# Patient Record
Sex: Female | Born: 1970
Health system: Southern US, Community
[De-identification: ages and names within clinical notes are randomized; demographics above are authoritative.]

## PROBLEM LIST (undated history)

## (undated) DIAGNOSIS — E119 Type 2 diabetes mellitus without complications: Secondary | ICD-10-CM

## (undated) DIAGNOSIS — N289 Disorder of kidney and ureter, unspecified: Secondary | ICD-10-CM

## (undated) DIAGNOSIS — I10 Essential (primary) hypertension: Secondary | ICD-10-CM

## (undated) DIAGNOSIS — I219 Acute myocardial infarction, unspecified: Secondary | ICD-10-CM

## (undated) DIAGNOSIS — I2510 Atherosclerotic heart disease of native coronary artery without angina pectoris: Secondary | ICD-10-CM

## (undated) HISTORY — PX: ABDOMINAL HYSTERECTOMY: SHX81

---

## 2018-02-28 ENCOUNTER — Emergency Department (HOSPITAL_COMMUNITY)
Admission: EM | Admit: 2018-02-28 | Discharge: 2018-02-28 | Discharge status: Against Medical Advice | Payer: Self-pay | Attending: Emergency Medicine | Admitting: Emergency Medicine

## 2018-02-28 ENCOUNTER — Encounter (HOSPITAL_COMMUNITY): Payer: Self-pay | Admitting: Emergency Medicine

## 2018-02-28 ENCOUNTER — Other Ambulatory Visit: Payer: Self-pay

## 2018-02-28 ENCOUNTER — Emergency Department (HOSPITAL_COMMUNITY): Payer: Self-pay

## 2018-02-28 DIAGNOSIS — F17210 Nicotine dependence, cigarettes, uncomplicated: Secondary | ICD-10-CM | POA: Insufficient documentation

## 2018-02-28 DIAGNOSIS — R072 Precordial pain: Secondary | ICD-10-CM | POA: Insufficient documentation

## 2018-02-28 DIAGNOSIS — Z79899 Other long term (current) drug therapy: Secondary | ICD-10-CM | POA: Insufficient documentation

## 2018-02-28 DIAGNOSIS — I10 Essential (primary) hypertension: Secondary | ICD-10-CM | POA: Insufficient documentation

## 2018-02-28 DIAGNOSIS — E119 Type 2 diabetes mellitus without complications: Secondary | ICD-10-CM | POA: Insufficient documentation

## 2018-02-28 DIAGNOSIS — R0602 Shortness of breath: Secondary | ICD-10-CM | POA: Insufficient documentation

## 2018-02-28 DIAGNOSIS — I2510 Atherosclerotic heart disease of native coronary artery without angina pectoris: Secondary | ICD-10-CM | POA: Insufficient documentation

## 2018-02-28 DIAGNOSIS — R110 Nausea: Secondary | ICD-10-CM | POA: Insufficient documentation

## 2018-02-28 HISTORY — DX: Disorder of kidney and ureter, unspecified: N28.9

## 2018-02-28 HISTORY — DX: Type 2 diabetes mellitus without complications: E11.9

## 2018-02-28 HISTORY — DX: Atherosclerotic heart disease of native coronary artery without angina pectoris: I25.10

## 2018-02-28 HISTORY — DX: Essential (primary) hypertension: I10

## 2018-02-28 HISTORY — DX: Acute myocardial infarction, unspecified: I21.9

## 2018-02-28 LAB — BASIC METABOLIC PANEL
ANION GAP: 10 (ref 5–15)
BUN: 18 mg/dL (ref 6–20)
CO2: 21 mmol/L — AB (ref 22–32)
Calcium: 9 mg/dL (ref 8.9–10.3)
Chloride: 107 mmol/L (ref 101–111)
Creatinine, Ser: 0.92 mg/dL (ref 0.44–1.00)
GFR calc Af Amer: >60 mL/min (ref >60)
GFR calc non Af Amer: >60 mL/min (ref >60)
GLUCOSE: 105 mg/dL — AB (ref 65–99)
POTASSIUM: 3.8 mmol/L (ref 3.5–5.1)
Sodium: 138 mmol/L (ref 135–145)

## 2018-02-28 LAB — I-STAT BETA HCG BLOOD, ED (MC, WL, AP ONLY): I-stat hCG, quantitative: <5 m[IU]/mL (ref <5)

## 2018-02-28 LAB — CBC
HEMATOCRIT: 40.3 % (ref 36.0–46.0)
HEMOGLOBIN: 13.1 g/dL (ref 12.0–15.0)
MCH: 28.1 pg (ref 26.0–34.0)
MCHC: 32.5 g/dL (ref 30.0–36.0)
MCV: 86.3 fL (ref 78.0–100.0)
Platelets: 258 10*3/uL (ref 150–400)
RBC: 4.67 MIL/uL (ref 3.87–5.11)
RDW: 14.9 % (ref 11.5–15.5)
WBC: 9.8 10*3/uL (ref 4.0–10.5)

## 2018-02-28 LAB — I-STAT TROPONIN, ED: Troponin i, poc: 0 ng/mL (ref 0.00–0.08)

## 2018-02-28 MED ORDER — ACETAMINOPHEN 325 MG PO TABS
650.0000 mg | ORAL_TABLET | Freq: Once | ORAL | Status: AC
  Administered 2018-02-28: 650 mg via ORAL
  Filled 2018-02-28: qty 2

## 2018-02-28 MED ORDER — DOCUSATE SODIUM 100 MG PO CAPS
100.00 | ORAL_CAPSULE | ORAL | Status: DC
Start: ? — End: 2018-02-28

## 2018-02-28 MED ORDER — BENZTROPINE MESYLATE 1 MG PO TABS
1.00 | ORAL_TABLET | ORAL | Status: DC
Start: ? — End: 2018-02-28

## 2018-02-28 MED ORDER — BENZTROPINE MESYLATE 1 MG PO TABS
0.50 | ORAL_TABLET | ORAL | Status: DC
Start: 2018-02-26 — End: 2018-02-28

## 2018-02-28 MED ORDER — NICOTINE 14 MG/24HR TD PT24
1.00 | MEDICATED_PATCH | TRANSDERMAL | Status: DC
Start: 2018-02-27 — End: 2018-02-28

## 2018-02-28 MED ORDER — LORAZEPAM 2 MG/ML IJ SOLN
2.00 | INTRAMUSCULAR | Status: DC
Start: ? — End: 2018-02-28

## 2018-02-28 MED ORDER — AMLODIPINE BESYLATE 5 MG PO TABS
5.00 | ORAL_TABLET | ORAL | Status: DC
Start: 2018-02-27 — End: 2018-02-28

## 2018-02-28 MED ORDER — ALUMINUM-MAGNESIUM-SIMETHICONE 200-200-20 MG/5ML PO SUSP
30.00 | ORAL | Status: DC
Start: ? — End: 2018-02-28

## 2018-02-28 MED ORDER — PROMETHAZINE HCL 25 MG PO TABS
25.00 | ORAL_TABLET | ORAL | Status: DC
Start: ? — End: 2018-02-28

## 2018-02-28 MED ORDER — FLUOXETINE HCL 20 MG PO CAPS
20.00 | ORAL_CAPSULE | ORAL | Status: DC
Start: 2018-02-27 — End: 2018-02-28

## 2018-02-28 MED ORDER — CLONIDINE HCL 0.1 MG PO TABS
.10 | ORAL_TABLET | ORAL | Status: DC
Start: ? — End: 2018-02-28

## 2018-02-28 MED ORDER — ZIPRASIDONE MESYLATE 20 MG IM SOLR
20.00 | INTRAMUSCULAR | Status: DC
Start: ? — End: 2018-02-28

## 2018-02-28 MED ORDER — HALOPERIDOL 5 MG PO TABS
10.00 | ORAL_TABLET | ORAL | Status: DC
Start: 2018-02-26 — End: 2018-02-28

## 2018-02-28 MED ORDER — HALOPERIDOL 5 MG PO TABS
5.00 | ORAL_TABLET | ORAL | Status: DC
Start: ? — End: 2018-02-28

## 2018-02-28 MED ORDER — HYDROXYZINE HCL 25 MG PO TABS
50.00 | ORAL_TABLET | ORAL | Status: DC
Start: ? — End: 2018-02-28

## 2018-02-28 MED ORDER — GENERIC EXTERNAL MEDICATION
5.00 | Status: DC
Start: ? — End: 2018-02-28

## 2018-02-28 MED ORDER — BISACODYL 5 MG PO TBEC
10.00 | DELAYED_RELEASE_TABLET | ORAL | Status: DC
Start: ? — End: 2018-02-28

## 2018-02-28 MED ORDER — DIPHENHYDRAMINE HCL 50 MG/ML IJ SOLN
50.00 | INTRAMUSCULAR | Status: DC
Start: ? — End: 2018-02-28

## 2018-02-28 MED ORDER — TRAZODONE HCL 100 MG PO TABS
100.00 | ORAL_TABLET | ORAL | Status: DC
Start: 2018-02-26 — End: 2018-02-28

## 2018-02-28 MED ORDER — ACETAMINOPHEN 325 MG PO TABS
650.00 | ORAL_TABLET | ORAL | Status: DC
Start: ? — End: 2018-02-28

## 2018-02-28 NOTE — ED Notes (Signed)
EDP Robinson-PA and RN at bedside, explained risks and benefits of leaving AMA. Pt states, "I'm ready to leave."

## 2018-02-28 NOTE — ED Provider Notes (Signed)
[facility] EMERGENCY DEPARTMENT Provider Note   CSN: [id number] Arrival date & time: 02/28/18  1237     History   Chief Complaint Chief Complaint  Patient presents with  . Chest Pain    HPI Marisa Jones is a 47 y.o. female with past medical history of hypertension, prediabetes, presenting to the ED with acute onset of substernal sharp and crushing chest pain that began this morning at rest.  Patient states pain was intermittent, and radiated to between her shoulder blades.  She states she also felt nauseous with the chest pain and short of breath.  She states the symptoms were intermittent.  No hx of DVT/PE, no recent immobility/trauma/surgery, no exogenous estrogen use. Patient reports a history of MI, however per chart review of cardiology visit in 2018 no documented past medical history of MI in cardiology documentation.  Patient had stress echo done on that visit, 11/17/2017, which was normal. ED visit in January with similar presentation, no documented hx MI and normal workup. Pt also with multiple stress tests in the past couple of years, all normal.  The history is provided by the patient.    Past Medical History:  Diagnosis Date  . Coronary artery disease   . Diabetes mellitus without complication (HCC)   . Hypertension   . MI (myocardial infarction) (HCC)   . Renal disorder     There are no active problems to display for this patient.   Past Surgical History:  Procedure Laterality Date  . ABDOMINAL HYSTERECTOMY       OB History   None      Home Medications    Prior to Admission medications   Medication Sig Start Date End Date Taking? Authorizing Provider  albuterol (PROAIR HFA) 108 (90 Base) MCG/ACT inhaler Inhale 1 puff into the lungs every 6 (six) hours as needed for wheezing.   Yes [provider]  amLODipine (NORVASC) 5 MG tablet Take 5 mg by mouth daily.   Yes [provider]    Family History History reviewed. No  pertinent family history.  Social History Social History   Tobacco Use  . Smoking status: Current Every Day Smoker    Packs/day: 0.50    Types: Cigarettes  . Smokeless tobacco: Never Used  Substance Use Topics  . Alcohol use: Yes    Comment: occassionally  . Drug use: Never     Allergies   Fish allergy and Lisinopril   Review of Systems Review of Systems  Constitutional: Negative for diaphoresis and fever.  Respiratory: Positive for shortness of breath. Negative for cough.   Cardiovascular: Positive for chest pain. Negative for palpitations and leg swelling.  Gastrointestinal: Positive for nausea. Negative for vomiting.  All other systems reviewed and are negative.    Physical Exam Updated Vital Signs BP (!) 158/111   Pulse 88   Temp 98.4 F (36.9 C) (Oral)   Resp 16   Ht 5\' 2"  (1.575 m)   Wt 72.6 kg (160 lb)   SpO2 95%   BMI 29.26 kg/m   Physical Exam  Constitutional: She appears well-developed and well-nourished. No distress.  HENT:  Head: Normocephalic and atraumatic.  Mouth/Throat: Oropharynx is clear and moist.  Eyes: Conjunctivae are normal.  Neck: Normal range of motion. Neck supple. No JVD present.  Cardiovascular: Normal rate, regular rhythm, normal heart sounds and intact distal pulses.  Pulmonary/Chest: Effort normal and breath sounds normal. No stridor. No respiratory distress. She has no wheezes. She has no  rales.  Abdominal: Soft. Bowel sounds are normal. She exhibits no distension. There is no tenderness. There is no rebound and no guarding.  Musculoskeletal:  No LE edema or tenderness  Lymphadenopathy:    She has no cervical adenopathy.  Neurological: She is alert.  Skin: Skin is warm. She is not diaphoretic.  Psychiatric: She has a normal mood and affect. Her behavior is normal.  Nursing note and vitals reviewed.    ED Treatments / Results  Labs (all labs ordered are listed, but only abnormal results are displayed) Labs Reviewed    BASIC METABOLIC PANEL - Abnormal; Notable for the following components:      Result Value   CO2 21 (*)    Glucose, Bld 105 (*)    All other components within normal limits  CBC  I-STAT TROPONIN, ED  I-STAT BETA HCG BLOOD, ED ([facility], [facility], [facility] ONLY)    EKG EKG Interpretation  Date/Time:  Wednesday February 28 2018 12:45:58 EDT Ventricular Rate:  90 PR Interval:    QRS Duration: 96 QT Interval:  396 QTC Calculation: 485 R Axis:   2 Text Interpretation:  Sinus rhythm Ventricular premature complex No old tracing to compare Confirmed by Melene Plan [phone]) on 02/28/2018 12:55:17 PM   Radiology Dg Chest 2 View  Result Date: 02/28/2018 CLINICAL DATA:  Chest pain and shortness of breath EXAM: CHEST - 2 VIEW COMPARISON:  None. FINDINGS: Lungs are clear. Heart size and pulmonary vascularity are normal. No adenopathy. No pneumothorax. No bone lesions. IMPRESSION: No edema or consolidation. Electronically Signed   By: Bretta Bang III M.D.   On: 02/28/2018 14:02    Procedures Procedures (including critical care time)  Medications Ordered in ED Medications  acetaminophen (TYLENOL) tablet 650 mg (650 mg Oral Given 02/28/18 1425)     Initial Impression / Assessment and Plan / ED Course  I have reviewed the triage vital signs and the nursing notes.  Pertinent labs & imaging results that were available during my care of the patient were reviewed by me and considered in my medical decision making (see chart for details).  Clinical Course as of Feb 28 1529  Wed Feb 28, 2018  1426 Repeat at 4:15pm.  I-stat troponin, ED [JR]    Clinical Course User Index [JR] Robinson, [place] N, PA-C    Pt presenting with chest pain that began at rest. Chest pain is not likely of cardiac or pulmonary etiology d/t presentation, no risk factors PE, VSS, no tracheal deviation, no JVD or new murmur, RRR, breath sounds equal bilaterally, EKG without acute abnormalities, initial troponin neg, and negative CXR. Pt  with multiple recent stress tests that were normal. Pt discussed with a evaluated by Dr. Adela Lank. Plan to repeat trop and if neg, pt is safe for discharge with PCP follow up.   3:30 PM While awaiting repeat trop that is due in 45 minutes, pt requesting to leave. She states she needs to catch a bus back to [place] and is ready to leave. Discussed risks of leaving, and stressed importance of follow up with PCP. Pt verbalized understanding of risks of leaving ED.   Given pt's normal recent stress tests, low suspicion for ACS as cause of symptoms.  We discussed the nature and purpose, risks and benefits, as well as, the alternatives of treatment. Time was given to allow the opportunity to ask questions and consider their options, and after the discussion, the patient decided to refuse the offerred treatment. The patient was  informed that refusal could lead to, but was not limited to, death, permanent disability, or severe pain. If present, I asked the relatives or significant others to dissuade them without success. Prior to refusing, I determined that the patient had the capacity to make their decision and understood the consequences of that decision. After refusal, I made every reasonable opportunity to treat them to the best of my ability.  The patient was notified that they may return to the emergency department at any time for further treatment.    Final Clinical Impressions(s) / ED Diagnoses   Final diagnoses:  Precordial chest pain    ED Discharge Orders    None       Robinson, [place] N, PA-C 02/28/18 1531    Melene PlanFloyd, Dan, DO 02/28/18 1617

## 2018-02-28 NOTE — ED Triage Notes (Signed)
Pt had sudden onset of crushing CP starting at 0900. Also complains of nausea and SOB. Pain 6/10. Pt given 2 nitro and 324mg  ASA from EMS. Pt has hx of MI. SR on monitor. BP 192/112 initially, after nitro, BP 130/92. HR 100.

## 2018-07-05 DIAGNOSIS — E538 Deficiency of other specified B group vitamins: Secondary | ICD-10-CM | POA: Insufficient documentation

## 2019-04-13 DIAGNOSIS — F319 Bipolar disorder, unspecified: Secondary | ICD-10-CM | POA: Insufficient documentation

## 2020-01-12 ENCOUNTER — Observation Stay (HOSPITAL_COMMUNITY)
Admission: EM | Admit: 2020-01-12 | Discharge: 2020-01-14 | Disposition: A | Discharge status: Home / Self Care | Payer: Medicaid Other | Attending: Family Medicine | Admitting: Family Medicine

## 2020-01-12 ENCOUNTER — Emergency Department (HOSPITAL_COMMUNITY): Payer: Medicaid Other

## 2020-01-12 ENCOUNTER — Inpatient Hospital Stay (HOSPITAL_COMMUNITY): Payer: Medicaid Other

## 2020-01-12 ENCOUNTER — Other Ambulatory Visit: Payer: Self-pay

## 2020-01-12 ENCOUNTER — Encounter (HOSPITAL_COMMUNITY): Payer: Self-pay

## 2020-01-12 DIAGNOSIS — R110 Nausea: Secondary | ICD-10-CM | POA: Diagnosis not present

## 2020-01-12 DIAGNOSIS — I10 Essential (primary) hypertension: Secondary | ICD-10-CM | POA: Insufficient documentation

## 2020-01-12 DIAGNOSIS — Z79899 Other long term (current) drug therapy: Secondary | ICD-10-CM | POA: Insufficient documentation

## 2020-01-12 DIAGNOSIS — Z91013 Allergy to seafood: Secondary | ICD-10-CM | POA: Insufficient documentation

## 2020-01-12 DIAGNOSIS — G459 Transient cerebral ischemic attack, unspecified: Secondary | ICD-10-CM | POA: Diagnosis present

## 2020-01-12 DIAGNOSIS — F319 Bipolar disorder, unspecified: Secondary | ICD-10-CM | POA: Diagnosis not present

## 2020-01-12 DIAGNOSIS — R29810 Facial weakness: Secondary | ICD-10-CM | POA: Insufficient documentation

## 2020-01-12 DIAGNOSIS — E785 Hyperlipidemia, unspecified: Secondary | ICD-10-CM | POA: Insufficient documentation

## 2020-01-12 DIAGNOSIS — Z888 Allergy status to other drugs, medicaments and biological substances status: Secondary | ICD-10-CM | POA: Insufficient documentation

## 2020-01-12 DIAGNOSIS — I2510 Atherosclerotic heart disease of native coronary artery without angina pectoris: Secondary | ICD-10-CM | POA: Insufficient documentation

## 2020-01-12 DIAGNOSIS — Z7902 Long term (current) use of antithrombotics/antiplatelets: Secondary | ICD-10-CM | POA: Insufficient documentation

## 2020-01-12 DIAGNOSIS — Z590 Homelessness: Secondary | ICD-10-CM | POA: Insufficient documentation

## 2020-01-12 DIAGNOSIS — E119 Type 2 diabetes mellitus without complications: Secondary | ICD-10-CM | POA: Diagnosis not present

## 2020-01-12 DIAGNOSIS — J45909 Unspecified asthma, uncomplicated: Secondary | ICD-10-CM | POA: Diagnosis not present

## 2020-01-12 DIAGNOSIS — R202 Paresthesia of skin: Secondary | ICD-10-CM

## 2020-01-12 DIAGNOSIS — Z20822 Contact with and (suspected) exposure to covid-19: Secondary | ICD-10-CM | POA: Insufficient documentation

## 2020-01-12 DIAGNOSIS — F17210 Nicotine dependence, cigarettes, uncomplicated: Secondary | ICD-10-CM | POA: Insufficient documentation

## 2020-01-12 DIAGNOSIS — I252 Old myocardial infarction: Secondary | ICD-10-CM | POA: Diagnosis not present

## 2020-01-12 DIAGNOSIS — Z7982 Long term (current) use of aspirin: Secondary | ICD-10-CM | POA: Insufficient documentation

## 2020-01-12 DIAGNOSIS — Z8249 Family history of ischemic heart disease and other diseases of the circulatory system: Secondary | ICD-10-CM | POA: Diagnosis not present

## 2020-01-12 DIAGNOSIS — I959 Hypotension, unspecified: Secondary | ICD-10-CM | POA: Insufficient documentation

## 2020-01-12 DIAGNOSIS — N179 Acute kidney failure, unspecified: Secondary | ICD-10-CM | POA: Insufficient documentation

## 2020-01-12 DIAGNOSIS — F1410 Cocaine abuse, uncomplicated: Secondary | ICD-10-CM | POA: Diagnosis not present

## 2020-01-12 LAB — COMPREHENSIVE METABOLIC PANEL
ALT: 75 U/L — ABNORMAL HIGH (ref 0–44)
AST: 36 U/L (ref 15–41)
Albumin: 3.7 g/dL (ref 3.5–5.0)
Alkaline Phosphatase: 152 U/L — ABNORMAL HIGH (ref 38–126)
Anion gap: 9 (ref 5–15)
BUN: 15 mg/dL (ref 6–20)
CO2: 24 mmol/L (ref 22–32)
Calcium: 9.1 mg/dL (ref 8.9–10.3)
Chloride: 107 mmol/L (ref 98–111)
Creatinine, Ser: 1.16 mg/dL — ABNORMAL HIGH (ref 0.44–1.00)
GFR calc Af Amer: >60 mL/min (ref >60)
GFR calc non Af Amer: 56 mL/min — ABNORMAL LOW (ref >60)
Glucose, Bld: 117 mg/dL — ABNORMAL HIGH (ref 70–99)
Potassium: 4.2 mmol/L (ref 3.5–5.1)
Sodium: 140 mmol/L (ref 135–145)
Total Bilirubin: 0.9 mg/dL (ref 0.3–1.2)
Total Protein: 6.6 g/dL (ref 6.5–8.1)

## 2020-01-12 LAB — PROTIME-INR
INR: 0.9 (ref 0.8–1.2)
Prothrombin Time: 12 seconds (ref 11.4–15.2)

## 2020-01-12 LAB — RAPID URINE DRUG SCREEN, HOSP PERFORMED
Amphetamines: NOT DETECTED
Barbiturates: NOT DETECTED
Benzodiazepines: NOT DETECTED
Cocaine: POSITIVE — AB
Opiates: NOT DETECTED
Tetrahydrocannabinol: NOT DETECTED

## 2020-01-12 LAB — I-STAT CHEM 8, ED
BUN: 18 mg/dL (ref 6–20)
Calcium, Ion: 1.13 mmol/L — ABNORMAL LOW (ref 1.15–1.40)
Chloride: 105 mmol/L (ref 98–111)
Creatinine, Ser: 1.2 mg/dL — ABNORMAL HIGH (ref 0.44–1.00)
Glucose, Bld: 115 mg/dL — ABNORMAL HIGH (ref 70–99)
HCT: 39 % (ref 36.0–46.0)
Hemoglobin: 13.3 g/dL (ref 12.0–15.0)
Potassium: 3.8 mmol/L (ref 3.5–5.1)
Sodium: 140 mmol/L (ref 135–145)
TCO2: 28 mmol/L (ref 22–32)

## 2020-01-12 LAB — DIFFERENTIAL
Abs Immature Granulocytes: 0.04 10*3/uL (ref 0.00–0.07)
Basophils Absolute: 0 10*3/uL (ref 0.0–0.1)
Basophils Relative: 0 %
Eosinophils Absolute: 0.1 10*3/uL (ref 0.0–0.5)
Eosinophils Relative: 1 %
Immature Granulocytes: 0 %
Lymphocytes Relative: 29 %
Lymphs Abs: 2.7 10*3/uL (ref 0.7–4.0)
Monocytes Absolute: 0.7 10*3/uL (ref 0.1–1.0)
Monocytes Relative: 8 %
Neutro Abs: 5.6 10*3/uL (ref 1.7–7.7)
Neutrophils Relative %: 62 %

## 2020-01-12 LAB — ETHANOL: Alcohol, Ethyl (B): <10 mg/dL (ref <10)

## 2020-01-12 LAB — URINALYSIS, ROUTINE W REFLEX MICROSCOPIC
Bacteria, UA: NONE SEEN
Bilirubin Urine: NEGATIVE
Glucose, UA: NEGATIVE mg/dL
Ketones, ur: NEGATIVE mg/dL
Leukocytes,Ua: NEGATIVE
Nitrite: NEGATIVE
Protein, ur: NEGATIVE mg/dL
Specific Gravity, Urine: 1.015 (ref 1.005–1.030)
pH: 6 (ref 5.0–8.0)

## 2020-01-12 LAB — CBC
HCT: 40.1 % (ref 36.0–46.0)
Hemoglobin: 12.9 g/dL (ref 12.0–15.0)
MCH: 28.2 pg (ref 26.0–34.0)
MCHC: 32.2 g/dL (ref 30.0–36.0)
MCV: 87.6 fL (ref 80.0–100.0)
Platelets: 297 10*3/uL (ref 150–400)
RBC: 4.58 MIL/uL (ref 3.87–5.11)
RDW: 15.4 % (ref 11.5–15.5)
WBC: 9.2 10*3/uL (ref 4.0–10.5)
nRBC: 0 % (ref 0.0–0.2)

## 2020-01-12 LAB — I-STAT BETA HCG BLOOD, ED (MC, WL, AP ONLY): I-stat hCG, quantitative: <5 m[IU]/mL (ref <5)

## 2020-01-12 LAB — SARS CORONAVIRUS 2 (TAT 6-24 HRS): SARS Coronavirus 2: NEGATIVE

## 2020-01-12 LAB — APTT: aPTT: 28 seconds (ref 24–36)

## 2020-01-12 MED ORDER — ASPIRIN 300 MG RE SUPP
300.0000 mg | Freq: Every day | RECTAL | Status: DC
  Filled 2020-01-12: qty 1

## 2020-01-12 MED ORDER — SENNOSIDES-DOCUSATE SODIUM 8.6-50 MG PO TABS: 1.0000 | ORAL_TABLET | Freq: Every evening | ORAL | Status: DC | PRN

## 2020-01-12 MED ORDER — SODIUM CHLORIDE 0.9 % IV SOLN: INTRAVENOUS | Status: DC

## 2020-01-12 MED ORDER — IOHEXOL 350 MG/ML SOLN
50.0000 mL | Freq: Once | INTRAVENOUS | Status: AC | PRN
  Administered 2020-01-12: 50 mL via INTRAVENOUS

## 2020-01-12 MED ORDER — ENOXAPARIN SODIUM 40 MG/0.4ML ~~LOC~~ SOLN
40.0000 mg | SUBCUTANEOUS | Status: DC
  Administered 2020-01-13 (×2): 40 mg via SUBCUTANEOUS
  Filled 2020-01-12 (×2): qty 0.4

## 2020-01-12 MED ORDER — ASPIRIN 325 MG PO TABS
325.0000 mg | ORAL_TABLET | Freq: Every day | ORAL | Status: DC
  Administered 2020-01-13 (×2): 325 mg via ORAL
  Filled 2020-01-12 (×2): qty 1

## 2020-01-12 MED ORDER — ACETAMINOPHEN 325 MG PO TABS
650.0000 mg | ORAL_TABLET | ORAL | Status: DC | PRN
  Administered 2020-01-12: 650 mg via ORAL
  Filled 2020-01-12: qty 2

## 2020-01-12 MED ORDER — STROKE: EARLY STAGES OF RECOVERY BOOK: Freq: Once | Status: DC

## 2020-01-12 MED ORDER — ACETAMINOPHEN 650 MG RE SUPP: 650.0000 mg | RECTAL | Status: DC | PRN

## 2020-01-12 MED ORDER — ALBUTEROL SULFATE (2.5 MG/3ML) 0.083% IN NEBU: 3.0000 mL | INHALATION_SOLUTION | Freq: Four times a day (QID) | RESPIRATORY_TRACT | Status: DC | PRN

## 2020-01-12 MED ORDER — ACETAMINOPHEN 160 MG/5ML PO SOLN: 650.0000 mg | ORAL | Status: DC | PRN

## 2020-01-12 NOTE — ED Notes (Signed)
Pt transported to MRI 

## 2020-01-12 NOTE — ED Notes (Signed)
Report attempted 

## 2020-01-12 NOTE — Consult Note (Addendum)
NEURO HOSPITALIST  CONSULT   Requesting Physician: Dr. Freida Busman    Chief Complaint: Left face and arm tingling/numbness and weakness  History obtained from:  Patient     HPI:                                                                                                                                         Marisa Jones is a 49 y.o. female with a PMHx of HTN who presented to the [facility] ED with c/c of left arm/facial numbness, tingling. Code stroke inititated in the ED  [facility]. She stated that she has had a H/A since yesterday. She was normal until after she finished smoking at about 1500. She then had sudden onset of facial numbness/tingling and left arm numbness and tingling. She also c/o blurred vision. Endorses smoking 1/2 pack of cigarettes per day and social drinking. Denies drug use (however, UDS was subsequently positive for cocaine).   She has no CP or SOB. She admits to only taking one blood pressure medication today instead of the two prescribed. No prior stroke history   ED course:  CTH: No hemorrhage BP: 182/110 BG: CTA: no LVO UDS: cocaine +  Date last known well: 01/12/20 Time last known well: 1500 tPA Given: No, NIHSS low and not with disabling symptoms Modified Rankin: Rankin Score=0 NIHSS: 2   Past Medical History:  Diagnosis Date  . Coronary artery disease   . Diabetes mellitus without complication (HCC)   . Hypertension   . MI (myocardial infarction) (HCC)   . Renal disorder     Past Surgical History:  Procedure Laterality Date  . ABDOMINAL HYSTERECTOMY      History reviewed. No pertinent family history.       Social History:  reports that she has been smoking cigarettes. She has been smoking about 0.50 packs per day. She has never used smokeless tobacco. She reports current alcohol use. She reports that she does not use drugs.  Allergies:  Allergies  Allergen Reactions   . Fish Allergy Anaphylaxis  . Lisinopril Swelling    Lip swelling    Medications:  No current facility-administered medications for this encounter.   Current Outpatient Medications  Medication Sig Dispense Refill  . albuterol (PROAIR HFA) 108 (90 Base) MCG/ACT inhaler Inhale 1 puff into the lungs every 6 (six) hours as needed for wheezing.    Marland Kitchen amLODipine (NORVASC) 5 MG tablet Take 5 mg by mouth daily.      ROS:                                                                                                                                       ROS was performed and is negative except as noted in the HPI.   General Examination:                                                                                                      Blood pressure (!) 144/93, pulse 81, temperature 98.8 F (37.1 C), temperature source Oral, resp. rate 13, height 5\' 2"  (1.575 m), weight 67.6 kg, SpO2 99 %.  Physical Exam  Constitutional: Appears well-developed and well-nourished.  Psych: Affect appropriate to situation Eyes: Normal external eye and conjunctiva. HENT: Normocephalic, no lesions, without obvious abnormality.   Musculoskeletal-no joint tenderness, deformity or swelling Cardiovascular: Normal rate and regular rhythm.  Respiratory: Effort normal, non-labored breathing saturations WNL GI: Soft.  No distension. There is no tenderness.  Skin: WDI  Neurological Examination Mental Status: Alert, oriented, thought content appropriate.  Speech fluent without evidence of aphasia.  Able to follow  commands without difficulty. Cranial Nerves: II: VFF. PERRL.  III,IV, VI: Ptosis not present, EOMI V,VII: Smile symmetric, extinction on left face present with DSS using light touch VIII: hearing intact to voice IX,X: Palate rises midline XI: Symmetric XII: midline tongue  extension Motor: Right : Upper extremity   5/5  Left:     Upper extremity   5/5  Lower extremity   5/5   Lower extremity   5/5 Tone and bulk:normal tone throughout; no atrophy noted Does have action tremor bilaterally.  Sensory: Cool temp and light touch sensation decreased on the left Deep Tendon Reflexes: 2+ and symmetric patellae, 3+ brisk biceps bilaterally Plantars: Right: downgoing   Left: downgoing Cerebellar: No ataxia with FNF and H-S bilaterally Gait: Deferred   Lab Results: Basic Metabolic Panel: Recent Labs  Lab 01/12/20 1651 01/12/20 1710  NA 140 140  K 4.2 3.8  CL 107 105  CO2 24  --   GLUCOSE 117* 115*  BUN 15 18  CREATININE 1.16* 1.20*  CALCIUM 9.1  --     CBC: Recent Labs  Lab 01/12/20 1651 01/12/20 1710  WBC 9.2  --   NEUTROABS 5.6  --   HGB 12.9 13.3  HCT 40.1 39.0  MCV 87.6  --   PLT 297  --     Imaging: CT Code Stroke CTA Head W/WO contrast  Result Date: 01/12/2020 CLINICAL DATA:  Left-sided arm and face numbness with tingling EXAM: CT ANGIOGRAPHY HEAD AND NECK TECHNIQUE: Multidetector CT imaging of the head and neck was performed using the standard protocol during bolus administration of intravenous contrast. Multiplanar CT image reconstructions and MIPs were obtained to evaluate the vascular anatomy. Carotid stenosis measurements (when applicable) are obtained utilizing NASCET criteria, using the distal internal carotid diameter as the denominator. CONTRAST:  53mL OMNIPAQUE IOHEXOL 350 MG/ML SOLN COMPARISON:  CTA of the head 07/03/2018 FINDINGS: CTA NECK FINDINGS Aortic arch: Normal.  Three vessel branching. Right carotid system: Vessels are smooth and widely patent. No atheromatous changes. Left carotid system: Vessels are smooth and widely patent. No atheromatous changes. Vertebral arteries: Minimal calcification at the origin of the right subclavian artery. The vertebral arteries are smooth and widely patent to the dura. Skeleton: Negative Other  neck: Negative Upper chest: Negative Review of the MIP images confirms the above findings CTA HEAD FINDINGS Anterior circulation: No branch occlusion, beading, or flow limiting stenosis. Negative for aneurysm. There is a small infundibulum at the supraclinoid left ICA Posterior circulation: Vertebral and basilar arteries are smooth and widely patent. Cerebellar and posterior cerebral arteries are unremarkable. Negative for aneurysm. Venous sinuses: Patent Anatomic variants: None significant Review of the MIP images confirms the above findings IMPRESSION: Negative CTA of the head and neck. Electronically Signed   By: Marnee Spring M.D.   On: 01/12/2020 17:50   CT HEAD CODE STROKE WO CONTRAST  Result Date: 01/12/2020 CLINICAL DATA:  Code stroke. Left arm and face numbness and tingling. EXAM: CT HEAD WITHOUT CONTRAST TECHNIQUE: Contiguous axial images were obtained from the base of the skull through the vertex without intravenous contrast. COMPARISON:  07/03/2018 brain MRI FINDINGS: Brain: No evidence of acute infarction, hemorrhage, hydrocephalus, extra-axial collection or mass lesion/mass effect. Vascular: No hyperdense vessel or unexpected calcification. Skull: Normal. Negative for fracture or focal lesion. Sinuses/Orbits: Negative Other: These results were communicated to Dr. Otelia Limes at 5:23 pmon 2/7/2021by text page via the [facility] messaging system. ASPECTS [facility] Stroke Program Early CT Score) - Ganglionic level infarction (caudate, lentiform nuclei, internal capsule, insula, M1-M3 cortex): 7 - Supraganglionic infarction (M4-M6 cortex): 3 Total score (0-10 with 10 being normal): 10 IMPRESSION: 1. Negative head CT. 2. ASPECTS is 10. Electronically Signed   By: Marnee Spring M.D.   On: 01/12/2020 17:23    Valentina Lucks, MSN, NP-C [facility] [phone] 01/12/2020, 5:48 PM   Assessment: 49 y.o. female with a PMHx of HTN and DM who presented to the [facility] ED with c/c of left face and arm  numbness/tingling.  1. CT head was negative for hemorrhage.  2. Stroke Risk Factors - CAD, diabetes mellitus and hypertension 3. The patient was determined not to be a tPA candidate due to minor symptoms and signs. Risks of tPA felt to significantly outweigh potential benefits. This was discussed with the patient who stated that she did not want to be treated with tPA given the risk of possible intracranial hemorrhage. All questions answered.  4. Not a candidate for IR d/t CTA being negative for LVO. 5. Most likely etiology  for her presentation is felt to be lacunar stroke or TIA secondary to vasospasm in the setting of sympathomimetic effects of cocaine (UDS positive).    Recommendations: -- MRI Brain  -- Echocardiogram -- Prophylactic therapy- Start ASA -- High intensity Statin if LDL > 70 -- HgbA1c, fasting lipid panel -- PT consult, OT consult, Speech consult -- Telemetry monitoring -- Frequent neuro checks -- Stroke swallow screen  -- Modified permissive HTN protocol for 24 hours due to being cocaine positive, which would increase the risk of ICH. Treat SBP if it increases above 160.  -- Please page stroke NP  Or  PA  Or MD from 8am -4 pm  as this patient from this time will be  followed by the stroke.   You can look them up on [email or web address]  Password [email or web address]  I have seen and examined the patient. I have formulated the assessment and recommendations. My exam findings were observed and documented by Valentina Lucks, NP.  Electronically signed: Dr. Caryl Pina

## 2020-01-12 NOTE — ED Provider Notes (Signed)
[facility] Provider Note   CSN: [id number] Arrival date & time: 01/12/20  1631     History Chief Complaint  Patient presents with  . Numbness    left sided face and arm  . Headache    Marisa Jones is a 49 y.o. female.  49 year old female who presents acute onset of left facial and left upper extremity paresthesias that started approximately an hour and 45 minutes prior to arrival.  Patient states she is had a headache for the past 2 days.  Does have a history of migraines but she says this feels different.  Denies any emesis but is had some nausea.  Denies any ataxia when walking.  No visual loss.  No recent fever chills or neck pain.  No weakness to her arms.  Symptoms have been persistent and no treatment use with them prior to arrival.        Past Medical History:  Diagnosis Date  . Coronary artery disease   . Diabetes mellitus without complication ([place])   . Hypertension   . MI (myocardial infarction) ([place])   . Renal disorder     There are no problems to display for this patient.   Past Surgical History:  Procedure Laterality Date  . ABDOMINAL HYSTERECTOMY       OB History   No obstetric history on file.     No family history on file.  Social History   Tobacco Use  . Smoking status: Current Every Day Smoker    Packs/day: 0.50    Types: Cigarettes  . Smokeless tobacco: Never Used  Substance Use Topics  . Alcohol use: Yes    Comment: occassionally  . Drug use: Never    Home Medications Prior to Admission medications   Medication Sig Start Date End Date Taking? Authorizing Provider  albuterol (PROAIR HFA) 108 (90 Base) MCG/ACT inhaler Inhale 1 puff into the lungs every 6 (six) hours as needed for wheezing.    [provider]  amLODipine (NORVASC) 5 MG tablet Take 5 mg by mouth daily.    [provider]    Allergies    Fish allergy and Lisinopril  Review of Systems   Review of Systems  All  other systems reviewed and are negative.   Physical Exam Updated Vital Signs Ht 1.575 m (5\' 2" )   Wt 67.6 kg   BMI 27.25 kg/m   Physical Exam Vitals and nursing note reviewed.  Constitutional:      General: She is not in acute distress.    Appearance: Normal appearance. She is well-developed. She is not toxic-appearing.  HENT:     Head: Normocephalic and atraumatic.  Eyes:     General: Lids are normal.     Conjunctiva/sclera: Conjunctivae normal.     Pupils: Pupils are equal, round, and reactive to light.  Neck:     Thyroid: No thyroid mass.     Trachea: No tracheal deviation.  Cardiovascular:     Rate and Rhythm: Normal rate and regular rhythm.     Heart sounds: Normal heart sounds. No murmur. No gallop.   Pulmonary:     Effort: Pulmonary effort is normal. No respiratory distress.     Breath sounds: Normal breath sounds. No stridor. No decreased breath sounds, wheezing, rhonchi or rales.  Abdominal:     General: Bowel sounds are normal. There is no distension.     Palpations: Abdomen is soft.     Tenderness: There is no abdominal  tenderness. There is no rebound.  Musculoskeletal:        General: No tenderness. Normal range of motion.     Cervical back: Normal range of motion and neck supple.  Skin:    General: Skin is warm and dry.     Findings: No abrasion or rash.  Neurological:     Mental Status: She is alert and oriented to person, place, and time.     GCS: GCS eye subscore is 4. GCS verbal subscore is 5. GCS motor subscore is 6.     Cranial Nerves: No cranial nerve deficit or dysarthria.     Sensory: Sensory deficit present.     Motor: No weakness or tremor.     Coordination: Finger-Nose-Finger Test normal.     Comments: Strength is 5 of 5 in upper as well as lower extremity.  Subjective paresthesias to left face and left upper extremity noted  Psychiatric:        Speech: Speech normal.        Behavior: Behavior normal.     ED Results / Procedures /  Treatments   Labs (all labs ordered are listed, but only abnormal results are displayed) Labs Reviewed  ETHANOL  PROTIME-INR  APTT  CBC  DIFFERENTIAL  COMPREHENSIVE METABOLIC PANEL  RAPID URINE DRUG SCREEN, HOSP PERFORMED  URINALYSIS, ROUTINE W REFLEX MICROSCOPIC  I-STAT CHEM 8, ED  I-STAT BETA HCG BLOOD, ED ([facility], [facility], [facility] ONLY)    EKG EKG Interpretation  Date/Time:  Sunday January 12 2020 16:46:21 EST Ventricular Rate:  85 PR Interval:    QRS Duration: 92 QT Interval:  393 QTC Calculation: 468 R Axis:   46 Text Interpretation: Sinus rhythm Confirmed by Lorre Nick ([id number]) on 01/12/2020 5:11:27 PM   Radiology No results found.  Procedures Procedures (including critical care time)  Medications Ordered in ED Medications - No data to display  ED Course  I have reviewed the triage vital signs and the nursing notes.  Pertinent labs & imaging results that were available during my care of the patient were reviewed by me and considered in my medical decision making (see chart for details).    MDM Rules/Calculators/A&P                      Patient called code stroke and seen by neurology, Dr. Otelia Limes.  Head CT showed no acute findings and CT angio of head and neck without LVO.  Recommends hospitalist admission. Final Clinical Impression(s) / ED Diagnoses Final diagnoses:  None    Rx / DC Orders ED Discharge Orders    None       Lorre Nick, MD 01/12/20 1756

## 2020-01-12 NOTE — ED Triage Notes (Signed)
Pt bib [facility] from [facility] [address] [facility] reports pt has HA and left sided numbness to face and left arm. Neg stroke screen. LKN 1500 today.  Hx of HTN, current smoker, reports that she takes meds.  Back sx in October

## 2020-01-12 NOTE — H&P (Signed)
Family Medicine Teaching [facility] Admission History and Physical Service Pager: [phone]  Patient name: Marisa Jones Medical record number: [id number] Date of birth: [birth year] Age: 49 y.o. Gender: female  Primary Care Provider: Center, [facility] Medical Consultants: Neurology Code Status: Full Code Preferred Emergency Contact: Yanelle Sousa, niece, [phone]  Chief Complaint: headache with left sided facial weakness  Assessment and Plan: Marisa Jones is a 49 y.o. female presenting with worsening headache and new onset left sided facial and arm numbness and weakness . PMH is significant for Hx of MI, CAD, HTN, HLD, Asthma, Bipolar Disorder, and multisubstance abuse.   Headache and Left sided facial and arm numbness Differential includes acute stroke vs TIA vs subdural hemorrhage vs subarachnoid hemorrhage vs seizure vs migraine headache. Given her risk factors for stroke reasonable for further work up. CT head non-contrast was negative. Symptoms have somewhat resolved and no facial droop or weakness seen. CT negative for brain bleed so subdural and subarachnoid hemorrhage not likely to be causing her symptoms. WBC normal so infectious etiology unlikely as patient is not altered. No history of LOC so Woke up with headache this morning hurting worse on the front and the side than usual. Still has a headache. Numbness of the left side of her face and left arm around 3 pm. No LOC, does endorse weakness of the left arm. No weakness of the left leg. States weakness and numbness are a little bit better. Still has a headache. Endorses blurry vision and nausea.  no fever, chills, cough, chest pain, SOB, abdominal, vomiting, no dysuria, diarrhea, or constipation. No falls, no recent trauma. - Admit to Med-Surg, Dr. Jennette Kettle attending - NPO until SLP - Neurology following; appreciate recs on whether to start Plavix vs Plavix + ASA - Telemetry for 24 hours - Frequent neuro checks - MRI of brain -  Echocardiogram - Carotid U/S dopplers - Lipid panel - Hgb A1c - TSH - Consult to OT/PT  AKI Serum creatnine of 1.20 on admission. Baseline from previous hospitalizations seem to be 0.9-1.0 - IVFs 75cc/hr until SLP - BMP in am - After SLP allows encourage good oral hydration  HTN amlodipine. Chronic, stable. SBP of 144-185 and DBP of 93-96 since admission. Home medications of amlodipine 5mg  and "one other one that starts with a c". Can't remember the other one.  - Holding home BP meds for permissive HTN - Check [facility] records for BP meds and dosages - vitals per floor  HLD. Chronic, stable - Obtaining lipid panel - Start statin as indicated if LDL >70  Asthma. Chronic, stable Patient only uses "when I need it". Not using every day, not waking up at night. - Albuterol inhaler as needed  CAD with Hx of MI. Chronic, stable - Vitals per floor - EKG in am - Telemetry for next 24 hours  Bipolar Disorder She takes Prozac 10 and Haloperidol 2mg  BIDfor psychotic features. [facility] for meds.  - Holding oral medications for now until SLP - Try to contact [facility] to confirm medications and dosages  Multisubstance Abuse. Chronic, stable. Hx of EtOH and Cocaine use. UDS positive for cocaine on admission. Ethanol < 10 - Cont to monitor heart rate - Avoid beta blockers  Homelessness. Chronic, stable. Patient currently living in a women's shelter - Consult to SW  FEN/GI: NPO until after SLP Prophylaxis: Lovenox  Disposition: admit to Med-Surg  History of Present Illness:  Marisa Jones is a 49 y.o. female presenting with left sided  facial and arm weakness and numbness. Patient states she has no prior history of stroke. She woke up this morning with headache. She has headaches but this one felt different as it was worse and it is unusual for her to wake up with a headache. Pain is located on the front and the left side and is more than usual.  Still has a headache during my exam. She states the numbness of the left side of her face and left arm around 3 pm. She has never had this before. She became concerned and came to the ED. She lives in a women's shelter currently. She denies any LOC, does endorse weakness of the left arm. No weakness of the left leg. States weakness and numbness are a little bit better. Endorses blurry vision and nausea.  no fever, chills, cough, chest pain, SOB, abdominal, vomiting, no dysuria, diarrhea, or constipation. No falls, no recent trauma.  Review Of Systems: Per HPI with the following additions:  Review of Systems  Constitutional: Negative for chills, diaphoresis and fever.  HENT: Negative for congestion.   Eyes: Positive for blurred vision.  Respiratory: Negative for cough, sputum production, shortness of breath and wheezing.   Cardiovascular: Negative for chest pain, orthopnea and leg swelling.  Gastrointestinal: Positive for nausea. Negative for abdominal pain, constipation, diarrhea and vomiting.  Genitourinary: Negative for dysuria, frequency and urgency.  Musculoskeletal: Negative for myalgias.  Skin: Negative for rash.  Neurological: Positive for sensory change, focal weakness and headaches. Negative for dizziness, speech change, loss of consciousness and weakness.    Patient Active Problem List   Diagnosis Date Noted  . TIA (transient ischemic attack) 01/12/2020    Past Medical History: Past Medical History:  Diagnosis Date  . Coronary artery disease   . Diabetes mellitus without complication ([place])   . Hypertension   . MI (myocardial infarction) ([place])   . Renal disorder     Past Surgical History: Past Surgical History:  Procedure Laterality Date  . ABDOMINAL HYSTERECTOMY      Social History: Social History   Tobacco Use  . Smoking status: Current Every Day Smoker    Packs/day: 0.50    Types: Cigarettes  . Smokeless tobacco: Never Used  Substance Use Topics  . Alcohol  use: Yes    Comment: occassionally  . Drug use: Never   Additional social history: Positive for Cocaine on admission, once every few months Please also refer to relevant sections of EMR.  Family History: History reviewed. No pertinent family history. Mom - HTN Dad - HTN, CAD  Allergies and Medications: Allergies  Allergen Reactions  . Fish Allergy Anaphylaxis  . Lisinopril Swelling    Lip swelling   No current facility-administered medications on file prior to encounter.   Current Outpatient Medications on File Prior to Encounter  Medication Sig Dispense Refill  . albuterol (PROAIR HFA) 108 (90 Base) MCG/ACT inhaler Inhale 1 puff into the lungs every 6 (six) hours as needed for wheezing.    Marland Kitchen amLODipine (NORVASC) 5 MG tablet Take 5 mg by mouth daily.      Objective: BP (!) 144/93   Pulse 81   Temp 98.8 F (37.1 C) (Oral)   Resp 13   Ht 5\' 2"  (1.575 m)   Wt 67.6 kg   SpO2 99%   BMI 27.25 kg/m  Exam: Gen: Alert and Oriented x 3, NAD HEENT: Normocephalic, atraumatic, PERRLA, EOMI, non-swollen, non-erythematous turbinates, non-erythematous pharyngeal mucosa, no exudates CV: RRR, no murmurs, normal S1,  S2 split Resp: CTAB, no wheezing, rales, or rhonchi, comfortable work of breathing Abd: non-distended, non-tender, soft, +bs in all four quadrants Ext: no clubbing, cyanosis, or edema Neuro: CN II-XII intact, 5/5 UE and LE strength bilaterally, decreased sensation on the left side of the face, no facial droop Skin: warm, dry, intact, no rashes  Labs and Imaging: CBC BMET  Recent Labs  Lab 01/12/20 1651 01/12/20 1651 01/12/20 1710  WBC 9.2  --   --   HGB 12.9   < > 13.3  HCT 40.1   < > 39.0  PLT 297  --   --    < > = values in this interval not displayed.   Recent Labs  Lab 01/12/20 1651 01/12/20 1651 01/12/20 1710  NA 140   < > 140  K 4.2   < > 3.8  CL 107   < > 105  CO2 24  --   --   BUN 15   < > 18  CREATININE 1.16*   < > 1.20*  GLUCOSE 117*   < >  115*  CALCIUM 9.1  --   --    < > = values in this interval not displayed.     EKG: NSR CT Head non-con: Negative head CT CTA: Negative for head and neck  Haliey, Romberg, DO 01/12/2020, 6:19 PM PGY-3, [facility] FPTS Intern pager: [phone], text pages welcome

## 2020-01-12 NOTE — ED Notes (Signed)
Patient transported to MRI 

## 2020-01-13 ENCOUNTER — Inpatient Hospital Stay (HOSPITAL_BASED_OUTPATIENT_CLINIC_OR_DEPARTMENT_OTHER): Payer: Medicaid Other

## 2020-01-13 DIAGNOSIS — R202 Paresthesia of skin: Secondary | ICD-10-CM | POA: Diagnosis not present

## 2020-01-13 DIAGNOSIS — F1410 Cocaine abuse, uncomplicated: Secondary | ICD-10-CM | POA: Diagnosis not present

## 2020-01-13 DIAGNOSIS — Z20822 Contact with and (suspected) exposure to covid-19: Secondary | ICD-10-CM | POA: Diagnosis not present

## 2020-01-13 DIAGNOSIS — G459 Transient cerebral ischemic attack, unspecified: Secondary | ICD-10-CM

## 2020-01-13 DIAGNOSIS — I10 Essential (primary) hypertension: Secondary | ICD-10-CM

## 2020-01-13 DIAGNOSIS — E785 Hyperlipidemia, unspecified: Secondary | ICD-10-CM | POA: Diagnosis not present

## 2020-01-13 DIAGNOSIS — F17200 Nicotine dependence, unspecified, uncomplicated: Secondary | ICD-10-CM

## 2020-01-13 DIAGNOSIS — E782 Mixed hyperlipidemia: Secondary | ICD-10-CM | POA: Diagnosis not present

## 2020-01-13 LAB — CBC WITH DIFFERENTIAL/PLATELET
Abs Immature Granulocytes: 0.03 10*3/uL (ref 0.00–0.07)
Basophils Absolute: 0 10*3/uL (ref 0.0–0.1)
Basophils Relative: 0 %
Eosinophils Absolute: 0.1 10*3/uL (ref 0.0–0.5)
Eosinophils Relative: 2 %
HCT: 37.8 % (ref 36.0–46.0)
Hemoglobin: 12.4 g/dL (ref 12.0–15.0)
Immature Granulocytes: 0 %
Lymphocytes Relative: 35 %
Lymphs Abs: 2.9 10*3/uL (ref 0.7–4.0)
MCH: 28.2 pg (ref 26.0–34.0)
MCHC: 32.8 g/dL (ref 30.0–36.0)
MCV: 86.1 fL (ref 80.0–100.0)
Monocytes Absolute: 0.7 10*3/uL (ref 0.1–1.0)
Monocytes Relative: 8 %
Neutro Abs: 4.4 10*3/uL (ref 1.7–7.7)
Neutrophils Relative %: 55 %
Platelets: 271 10*3/uL (ref 150–400)
RBC: 4.39 MIL/uL (ref 3.87–5.11)
RDW: 15.3 % (ref 11.5–15.5)
WBC: 8.1 10*3/uL (ref 4.0–10.5)
nRBC: 0 % (ref 0.0–0.2)

## 2020-01-13 LAB — LIPID PANEL
Cholesterol: 320 mg/dL — ABNORMAL HIGH (ref 0–200)
HDL: 47 mg/dL (ref >40)
LDL Cholesterol: 222 mg/dL — ABNORMAL HIGH (ref 0–99)
Total CHOL/HDL Ratio: 6.8 RATIO
Triglycerides: 253 mg/dL — ABNORMAL HIGH (ref <150)
VLDL: 51 mg/dL — ABNORMAL HIGH (ref 0–40)

## 2020-01-13 LAB — COMPREHENSIVE METABOLIC PANEL
ALT: 60 U/L — ABNORMAL HIGH (ref 0–44)
AST: 22 U/L (ref 15–41)
Albumin: 3.5 g/dL (ref 3.5–5.0)
Alkaline Phosphatase: 137 U/L — ABNORMAL HIGH (ref 38–126)
Anion gap: 13 (ref 5–15)
BUN: 11 mg/dL (ref 6–20)
CO2: 24 mmol/L (ref 22–32)
Calcium: 8.9 mg/dL (ref 8.9–10.3)
Chloride: 103 mmol/L (ref 98–111)
Creatinine, Ser: 0.76 mg/dL (ref 0.44–1.00)
GFR calc Af Amer: >60 mL/min (ref >60)
GFR calc non Af Amer: >60 mL/min (ref >60)
Glucose, Bld: 98 mg/dL (ref 70–99)
Potassium: 3.5 mmol/L (ref 3.5–5.1)
Sodium: 140 mmol/L (ref 135–145)
Total Bilirubin: 0.9 mg/dL (ref 0.3–1.2)
Total Protein: 6.2 g/dL — ABNORMAL LOW (ref 6.5–8.1)

## 2020-01-13 LAB — ECHOCARDIOGRAM COMPLETE
Height: 62 in
Weight: 2384 oz

## 2020-01-13 LAB — HEMOGLOBIN A1C
Hgb A1c MFr Bld: 5.8 % — ABNORMAL HIGH (ref 4.8–5.6)
Mean Plasma Glucose: 120 mg/dL

## 2020-01-13 LAB — HIV ANTIBODY (ROUTINE TESTING W REFLEX): HIV Screen 4th Generation wRfx: NONREACTIVE

## 2020-01-13 MED ORDER — TRAZODONE HCL 150 MG PO TABS
300.0000 mg | ORAL_TABLET | Freq: Every evening | ORAL | Status: DC | PRN
  Administered 2020-01-13: 300 mg via ORAL
  Filled 2020-01-13: qty 2

## 2020-01-13 MED ORDER — CLOPIDOGREL BISULFATE 75 MG PO TABS
75.0000 mg | ORAL_TABLET | Freq: Every day | ORAL | Status: DC
  Administered 2020-01-13 – 2020-01-14 (×2): 75 mg via ORAL
  Filled 2020-01-13 (×2): qty 1

## 2020-01-13 MED ORDER — ASPIRIN EC 81 MG PO TBEC
81.0000 mg | DELAYED_RELEASE_TABLET | Freq: Every day | ORAL | Status: DC
  Administered 2020-01-14: 81 mg via ORAL
  Filled 2020-01-13: qty 1

## 2020-01-13 MED ORDER — ATORVASTATIN CALCIUM 40 MG PO TABS: 40.0000 mg | ORAL_TABLET | Freq: Every day | ORAL | Status: DC

## 2020-01-13 MED ORDER — ATORVASTATIN CALCIUM 80 MG PO TABS
80.0000 mg | ORAL_TABLET | Freq: Every day | ORAL | Status: DC
  Administered 2020-01-13: 80 mg via ORAL
  Filled 2020-01-13: qty 1

## 2020-01-13 NOTE — Progress Notes (Signed)
  Echocardiogram 2D Echocardiogram has been performed.  Deone Omahoney G Zeyna Mkrtchyan 01/13/2020, 1:47 PM

## 2020-01-13 NOTE — Evaluation (Signed)
Physical Therapy Evaluation Patient Details Name: Marisa Jones MRN: [id number] DOB: 1971/06/14 Today's Date: 01/13/2020   History of Present Illness  Marisa Jones is a 49 y.o. female presenting with worsening headache and new onset left sided facial and arm numbness and weakness . PMH is significant for Hx of MI, CAD, HTN, HLD, Asthma, Bipolar Disorder, and multisubstance abuse. Pt with likely TIA and testing continues at this time.    Clinical Impression  PT eval complete. Pt independent with all functional mobility. No further skilled PT intervention indicated. PT signing off.    Follow Up Recommendations No PT follow up    Equipment Recommendations  None recommended by PT    Recommendations for Other Services       Precautions / Restrictions Precautions Precautions: Fall      Mobility  Bed Mobility Overal bed mobility: Independent                Transfers Overall transfer level: Independent Equipment used: None                Ambulation/Gait Ambulation/Gait assistance: Independent Gait Distance (Feet): 250 Feet Assistive device: None Gait Pattern/deviations: Step-through pattern;Decreased stride length Gait velocity: WFL Gait velocity interpretation: >2.62 ft/sec, indicative of community ambulatory General Gait Details: [facility]  Stairs            Wheelchair Mobility    Modified Rankin (Stroke Patients Only)       Balance Overall balance assessment: Independent                                           Pertinent Vitals/Pain Pain Assessment: No/denies pain    Home Living Family/patient expects to be discharged to:: Shelter/Homeless                 Additional Comments: Pt reports living at [facility].    Prior Function Level of Independence: Independent               Hand Dominance   Dominant Hand: Right    Extremity/Trunk Assessment   Upper Extremity Assessment Upper Extremity Assessment: Overall  WFL for tasks assessed    Lower Extremity Assessment Lower Extremity Assessment: Overall WFL for tasks assessed    Cervical / Trunk Assessment Cervical / Trunk Assessment: Normal  Communication   Communication: No difficulties  Cognition Arousal/Alertness: Awake/alert Behavior During Therapy: WFL for tasks assessed/performed;Flat affect Overall Cognitive Status: Within Functional Limits for tasks assessed                                        General Comments General comments (skin integrity, edema, etc.): VSS    Exercises     Assessment/Plan    PT Assessment Patent does not need any further PT services  PT Problem List         PT Treatment Interventions      PT Goals (Current goals can be found in the Care Plan section)  Acute Rehab PT Goals Patient Stated Goal: none stated PT Goal Formulation: All assessment and education complete, DC therapy    Frequency     Barriers to discharge        Co-evaluation               AM-PAC PT "6 Clicks" Mobility  Outcome Measure Help needed turning from your back to your side while in a flat bed without using bedrails?: None Help needed moving from lying on your back to sitting on the side of a flat bed without using bedrails?: None Help needed moving to and from a bed to a chair (including a wheelchair)?: None Help needed standing up from a chair using your arms (e.g., wheelchair or bedside chair)?: None Help needed to walk in hospital room?: None Help needed climbing 3-5 steps with a railing? : None 6 Click Score: 24    End of Session   Activity Tolerance: Patient tolerated treatment well Patient left: in bed;with call bell/phone within reach Nurse Communication: Mobility status PT Visit Diagnosis: Difficulty in walking, not elsewhere classified (R26.2)    Time: 2542-7062 PT Time Calculation (min) (ACUTE ONLY): 11 min   Charges:   PT Evaluation $PT Eval Low Complexity: 1 Low           Marisa Jones, PT  Office # [phone] Pager [phone]   Marisa Jones 01/13/2020, 12:25 PM

## 2020-01-13 NOTE — Evaluation (Signed)
Speech Language Pathology Evaluation Patient Details Name: Marisa Jones MRN: [id number] DOB: 1971/08/21 Today's Date: 01/13/2020 Time: 0160-1093 SLP Time Calculation (min) (ACUTE ONLY): 14 min  Problem List:  Patient Active Problem List   Diagnosis Date Noted  . TIA (transient ischemic attack) 01/12/2020   Past Medical History:  Past Medical History:  Diagnosis Date  . Coronary artery disease   . Diabetes mellitus without complication ([facility])   . Hypertension   . MI (myocardial infarction) ([facility])   . Renal disorder    Past Surgical History:  Past Surgical History:  Procedure Laterality Date  . ABDOMINAL HYSTERECTOMY     HPI:  Marisa Jones is a 49 y.o. female presenting with worsening headache and new onset left sided facial and arm numbness and weakness . PMH is significant for Hx of MI, CAD, HTN, HLD, Asthma, Bipolar Disorder, and multisubstance abuse. Pt with likely TIA and testing continues at this time.   Assessment / Plan / Recommendation Clinical Impression   Patient received in room for evaluation of speech/language. Patient presents with expressive/receptive languge skills that are grossly Marisa Jones. No evidence of cognitive impairments based on tasks given today. Patient oriented x4. No evidence of dysarthria or motor speech impairment.   Patient with no obvious facial asymmetry, though she endorses "a little tingling" on the left side of her face.  Patient able to answer basic and complex yes/no questions, follow simple and complex multi-step verbal commands and engage in automatic speech and repetition tasks without difficulty. Confrontation naming intact, pt denies word-finding problems. Simple (phrase length) reading assessed, patient able to read without difficulty, though she reports some blurry vision that started "yesterday or the day before."  Patient able to verbally initiate and engage in simple conversation, speech fluent and clear. Portions of the COGNISTAT administered to aid  in assessment of cognitive-linguistic function, pt scored WFL on all portions given. See below for details. COGNISTAT Orientation 12/12 wfl Attention 8/8 wfl Memory 12/12 wfl Naming 8/8 wfl Reasoning, similarities: 6/8 wfl Reasoning, judgement: 6/6 wfl  No ST services are indicated at this time. Please re-consult should new needs arise. ST reviewed BEFAST acronym with patient to provide edu for stroke awareness. Pt verbalized understanding. Thank you for this referral.      SLP Assessment  SLP Recommendation/Assessment: Patient does not need any further Speech Lanaguage Pathology Services SLP Visit Diagnosis: Cognitive communication deficit (R41.841)    Follow Up Recommendations  None    Frequency and Duration           SLP Evaluation Cognition  Overall Cognitive Status: Within Functional Limits for tasks assessed Orientation Level: Oriented X4 Attention: Sustained Sustained Attention: Appears intact Memory: Appears intact Awareness: Appears intact Problem Solving: Appears intact Executive Function: Reasoning Reasoning: Appears intact Safety/Judgment: Appears intact       Comprehension  Auditory Comprehension Overall Auditory Comprehension: Appears within functional limits for tasks assessed Reading Comprehension Reading Status: Within funtional limits    Expression Expression Primary Mode of Expression: Verbal Verbal Expression Overall Verbal Expression: Appears within functional limits for tasks assessed Initiation: No impairment Automatic Speech: Counting Level of Generative/Spontaneous Verbalization: Conversation Repetition: No impairment Naming: No impairment Written Expression Dominant Hand: Right Written Expression: Not tested   Oral / Motor  Oral Motor/Sensory Function Overall Oral Motor/Sensory Function: Mild impairment Facial Symmetry: Within Functional Limits Facial Sensation: Other (Comment);Reduced left(reports left facial "tingling") Motor  Speech Overall Motor Speech: Appears within functional limits for tasks assessed   GO  Marisa Jones 01/13/2020, [place], M.Ed., [facility] Acute Rehabilitation [phone]: Acute Rehab office [phone] - pager

## 2020-01-13 NOTE — Discharge Summary (Addendum)
Family Medicine Teaching [facility] Discharge Summary  Patient name: Marisa Jones Medical record number: [id number] Date of birth: Aug 31, 1971 Age: 49 y.o. Gender: female Date of Admission: 01/12/2020  Date of Discharge: 01/14/2020 Admitting Physician: Arlyce Harman, DO  Primary Care Provider: Center, [facility] Medical Consultants: Neuro  Indication for Hospitalization: Code Stroke  Discharge Diagnoses/Problem List:  TIA Hypertension Hyperlipidemia Cocaine abuse Tobacco abuse AKI Asthma Bipolar Homelessness  Disposition: Home  Discharge Condition: Stable  Discharge Exam: 01/14/2020 General: 49 year old female, looks older than stated age in no acute distress Cardiovascular: RRR with no murmurs noted Respiratory: CTA bilaterally     Gastrointestinal: Bowel sounds present. No abdominal pain Extremities: no lower extremity edema Neuro: Alert and orientated x3, CN II-XII intact, sensation normal, motor normal Psych: Behavior and speech appropriate to situation  Brief Hospital Course:  Patient presented to the ED with sudden onset of left sided facial weakness and numbness.  A code stroke was initiated.  CT head and MRI negative for any mass, acute CVA or intracranial hemorrhage.  CTA head and neck negative for stenosis.  Neurology was consulted and thought findings were consistent with TIA.  Recommended echo, high-dose statin, EC ASA and Plavix for 3 months then transition to Plavix alone.   Labs significant for UDS positive for cocaine, LDL 222, triglycerides 253, VLDL 51, cholesterol 320.  EKG normal sinus rhythm.  Echo showed left ventricular ejection fraction 60-65%, with left ventricular normal function.  Right ventricular systolic function was normal. PT and OT recommended no further therapy.  Patient was started on Atorvastatin 80 mg daily, EC ASA 81 mg daily and Plavix 75 mg daily to continue on discharge.  On the day of discharge symptoms resolved and patient was advised to  abstain from cocaine use.  Issues for Follow Up:  1. ASA and Plavix for months, then Plavix monotherapy.  Recommend if patient cannot afford to maintain Plavix monotherapy then continue ASA therapy. 2. NO BETA BLOCKERS, UDS positive for Cocaine 3. Follow up with stroke clinic NP at [facility] in 4 weeks 4. Recommend rehab for substance abuse.  Significant Procedures: None  Significant Labs and Imaging:  Recent Labs  Lab 01/12/20 1651 01/12/20 1710 01/13/20 0219  WBC 9.2  --  8.1  HGB 12.9 13.3 12.4  HCT 40.1 39.0 37.8  PLT 297  --  271   Recent Labs  Lab 01/12/20 1651 01/12/20 1651 01/12/20 1710 01/12/20 1710 01/13/20 0219 01/14/20 0312  NA 140  --  140  --  140 139  K 4.2   < > 3.8   < > 3.5 4.0  CL 107  --  105  --  103 105  CO2 24  --   --   --  24 21*  GLUCOSE 117*  --  115*  --  98 106*  BUN 15  --  18  --  11 19  CREATININE 1.16*  --  1.20*  --  0.76 0.75  CALCIUM 9.1  --   --   --  8.9 9.0  ALKPHOS 152*  --   --   --  137*  --   AST 36  --   --   --  22  --   ALT 75*  --   --   --  60*  --   ALBUMIN 3.7  --   --   --  3.5  --    < > = values in this interval not displayed.  EKG-NSR  Results/Tests Pending at Time of Discharge:     Discharge Medications:  Allergies as of 01/14/2020      Reactions   Fish Allergy Anaphylaxis   Lisinopril Swelling   Lip swelling      Medication List    STOP taking these medications   metoprolol succinate 25 MG 24 hr tablet Commonly known as: TOPROL-XL     TAKE these medications   amLODipine 10 MG tablet Commonly known as: NORVASC Take 10 mg by mouth daily.   aspirin 81 MG EC tablet Take 81 mg by mouth daily.   atorvastatin 80 MG tablet Commonly known as: LIPITOR Take 1 tablet (80 mg total) by mouth daily at 6 PM. What changed:   medication strength  how much to take  when to take this   clopidogrel 75 MG tablet Commonly known as: PLAVIX Take 1 tablet (75 mg total) by mouth daily.   FLUoxetine 10 MG  capsule Commonly known as: PROZAC Take 10 mg by mouth daily.   gabapentin 600 MG tablet Commonly known as: NEURONTIN Take 1,200 mg by mouth 3 (three) times daily.   haloperidol 2 MG tablet Commonly known as: HALDOL Take 2 mg by mouth 2 (two) times daily.   nitroGLYCERIN 0.4 MG SL tablet Commonly known as: NITROSTAT Place 0.4 mg under the tongue every 5 (five) minutes as needed for chest pain.   oxyCODONE-acetaminophen 10-325 MG tablet Commonly known as: PERCOCET Take 1 tablet by mouth 3 (three) times daily.   ProAir HFA 108 (90 Base) MCG/ACT inhaler Generic drug: albuterol Inhale 1 puff into the lungs every 6 (six) hours as needed for wheezing.   senna-docusate 8.6-50 MG tablet Commonly known as: Senokot-S Take 1 tablet by mouth at bedtime as needed for mild constipation.   trazodone 300 MG tablet Commonly known as: DESYREL Take 300 mg by mouth at bedtime.       Discharge Instructions: Please refer to Patient Instructions section of EMR for full details.  Patient was counseled important signs and symptoms that should prompt return to medical care, changes in medications, dietary instructions, activity restrictions, and follow up appointments.   Follow-Up Appointments: Follow-up Information    [facility]. Schedule an appointment as soon as possible for a visit in 4 week(s).   Specialty: Neurology Contact information: [address] [place], [place] Follow up.   Why: please schedule a follow up appointment with your primary care provider within the next week.   Contact information: [address] [place] [address] [phone]           Carollee Leitz, MD 01/15/2020, 6:47 PM PGY-1, [place]  Resident Attestation   I saw and evaluated the patient, performing the key elements of the service. I personally performed or re-performed the history,  physical exam, and medical decision making activities of this service and have verified that the service and findings are accurately documented in the resident's note. I developed the management plan that is described in the resident's note, and I agree with the content, with my edits above in red.   Harolyn Rutherford, DO [facility], PGY-3

## 2020-01-13 NOTE — Progress Notes (Addendum)
Family Medicine Teaching Service Daily Progress Note Intern Pager: [phone]  Patient name: Marisa Jones Medical record number: [id number] Date of birth: 1971-08-06 Age: 49 y.o. Gender: female  Primary Care Provider: [facility]: Neuro Code Status: Full  Pt Overview and Major Events to Date:  01/07-admitted  Assessment and Plan: Marisa Jones is a 49 y.o. female presenting with worsening headache and new onset left sided facial and arm numbness and weakness . PMH is significant for Hx of MI, CAD, HTN, HLD, Asthma, Bipolar Disorder, and multisubstance abuse.  Headache and Left sided facial and arm numbness Vital signs stable overnight.  [place] swallow exam past.  CTA head and neck normal, CT head normal and MRI brain normal findings.  Reports still having some left-sided weakness, blurred vision and mild headaches.  Denies any chest pain, shortness of breath, abdominal pain, nausea or vomiting.  On exam patient is alert and orientated x4.  Mild left-sided weakness 4/5.  Incision normal. -Neurology following appreciate recommendations:                                         -ASA and Plavix x3 months then Plavix monotherapy                                         -Follow up with stroke clinic NP at [facility] in 4 weeks                                         -signed off  -[facility] for assistance with medications and follow up on discharge -Regular diet -Continue cardiac monitoring and neuro checks -Echo completed, follow-up written report -Follow-up carotid Dopplers -Follow-up HbA1c, TSH, HIV -Follow-up PT/OT eval   AKI Resolved, serum creatinine 0.76 -BMP in a.m. -Encourage oral hydration after swallow exam  HTN Stable Normotensive overnight.  Systolic blood pressures 109-158.   -Continue to hold BP meds for permissive hypertension, home meds include amlodipine 5 mg daily and another 1 patient cannot remember name -Vital signs per  unit  HLD. Chronic Labs significant for LDL of 222, triglycerides 993 and VLDL 51.  -Increase atorvastatin 80mg  daily  Asthma. Chronic Stable.  Required no albuterol therapy overnight -Continue weight loss as needed  CAD with Hx of MI. Chronic, stable -Vitals per floor -Continue cardiac monitoring  Bipolar Disorder Stable.  Patient reports he takes Prozac 10 mg and haloperidol 2 mg twice daily for psychotic features.   -Follow-up with [facility] to confirm medication and dosage  Multisubstance Abuse. Chronic, stable. UDS positive for cocaine on admission.  EtOH less than 10 -Avoid beta-blockers -Monitor for signs of withdrawal  Homelessness. Chronic, stable. Patient currently living in a women's shelter - Consult to SW  FEN/GI: NPO until after SLP Prophylaxis: Lovenox  Disposition: Home when medically stable, pending neuro recs  Subjective:  No acute events overnight.  Denies any shortness of breath, chest pain or abdominal pain.  Reports no photophobia or nausea. Still reports having mild headaches and blurry vision and mild left-sided weakness.  Objective: Temp:  [98.2 F (36.8 C)-98.8 F (37.1 C)] 98.2 F (36.8 C) (02/08 0453) Pulse Rate:  [67-91] 71 (02/08  4174) Resp:  [13-19] 18 (02/08 0453) BP: (109-185)/(72-98) 109/76 (02/08 0453) SpO2:  [93 %-99 %] 96 % (02/08 0453) Weight:  [67.6 kg] 67.6 kg (02/07 1643) Physical Exam: General: 49 year old female appears older than stated age in no acute distress Cardiovascular: Regular rate and rhythm, no murmurs or gallops appreciated Respiratory: Chest clear to auscultation bilaterally, no wheezes or crackles noted, no increased work of breathing Abdomen: Soft, nontender, bowel sounds present Extremities: Mild weakness left upper and lower extremity, no lower extremity edema noted. Neuro: Alert and orientated x4, CN II-XII, mild left-sided weakness, good sensation.  Laboratory: Recent Labs  Lab  01/12/20 1651 01/12/20 1710 01/13/20 0219  WBC 9.2  --  8.1  HGB 12.9 13.3 12.4  HCT 40.1 39.0 37.8  PLT 297  --  271   Recent Labs  Lab 01/12/20 1651 01/12/20 1710 01/13/20 0219  NA 140 140 140  K 4.2 3.8 3.5  CL 107 105 103  CO2 24  --  24  BUN 15 18 11   CREATININE 1.16* 1.20* 0.76  CALCIUM 9.1  --  8.9  PROT 6.6  --  6.2*  BILITOT 0.9  --  0.9  ALKPHOS 152*  --  137*  ALT 75*  --  60*  AST 36  --  22  GLUCOSE 117* 115* 98      Imaging/Diagnostic Tests: CT Code Stroke CTA Head W/WO contrast  Result Date: 01/12/2020 CLINICAL DATA:  Left-sided arm and face numbness with tingling EXAM: CT ANGIOGRAPHY HEAD AND NECK TECHNIQUE: Multidetector CT imaging of the head and neck was performed using the standard protocol during bolus administration of intravenous contrast. Multiplanar CT image reconstructions and MIPs were obtained to evaluate the vascular anatomy. Carotid stenosis measurements (when applicable) are obtained utilizing NASCET criteria, using the distal internal carotid diameter as the denominator. CONTRAST:  59mL OMNIPAQUE IOHEXOL 350 MG/ML SOLN COMPARISON:  CTA of the head 07/03/2018 FINDINGS: CTA NECK FINDINGS Aortic arch: Normal.  Three vessel branching. Right carotid system: Vessels are smooth and widely patent. No atheromatous changes. Left carotid system: Vessels are smooth and widely patent. No atheromatous changes. Vertebral arteries: Minimal calcification at the origin of the right subclavian artery. The vertebral arteries are smooth and widely patent to the dura. Skeleton: Negative Other neck: Negative Upper chest: Negative Review of the MIP images confirms the above findings CTA HEAD FINDINGS Anterior circulation: No branch occlusion, beading, or flow limiting stenosis. Negative for aneurysm. There is a small infundibulum at the supraclinoid left ICA Posterior circulation: Vertebral and basilar arteries are smooth and widely patent. Cerebellar and posterior cerebral  arteries are unremarkable. Negative for aneurysm. Venous sinuses: Patent Anatomic variants: None significant Review of the MIP images confirms the above findings IMPRESSION: Negative CTA of the head and neck. Electronically Signed   By: Monte Fantasia M.D.   On: 01/12/2020 17:50   CT Code Stroke CTA Neck W/WO contrast  Result Date: 01/12/2020 : Please see CTA head dictation. Electronically Signed   By: Monte Fantasia M.D.   On: 01/12/2020 17:59   MR BRAIN WO CONTRAST  Result Date: 01/12/2020 CLINICAL DATA:  Headache.  Numbness of the left face and arm. EXAM: MRI HEAD WITHOUT CONTRAST TECHNIQUE: Multiplanar, multiecho pulse sequences of the brain and surrounding structures were obtained without intravenous contrast. COMPARISON:  CT study same day FINDINGS: Brain: The brain has a normal appearance without evidence of malformation, atrophy, old or acute small or large vessel infarction, mass lesion, hemorrhage, hydrocephalus or extra-axial  collection. Vascular: Major vessels at the base of the brain show flow. Venous sinuses appear patent. Skull and upper cervical spine: Normal. Sinuses/Orbits: Mild mucosal thickening of the left maxillary sinus. Other sinuses clear. Orbits negative. Other: None significant. IMPRESSION: Normal study. Electronically Signed   By: Paulina Fusi M.D.   On: 01/12/2020 22:09   CT HEAD CODE STROKE WO CONTRAST  Result Date: 01/12/2020 CLINICAL DATA:  Code stroke. Left arm and face numbness and tingling. EXAM: CT HEAD WITHOUT CONTRAST TECHNIQUE: Contiguous axial images were obtained from the base of the skull through the vertex without intravenous contrast. COMPARISON:  07/03/2018 brain MRI FINDINGS: Brain: No evidence of acute infarction, hemorrhage, hydrocephalus, extra-axial collection or mass lesion/mass effect. Vascular: No hyperdense vessel or unexpected calcification. Skull: Normal. Negative for fracture or focal lesion. Sinuses/Orbits: Negative Other: These results were  communicated to Dr. Otelia Limes at 5:23 pmon 2/7/2021by text page via the [facility] messaging system. ASPECTS [facility] Stroke Program Early CT Score) - Ganglionic level infarction (caudate, lentiform nuclei, internal capsule, insula, M1-M3 cortex): 7 - Supraganglionic infarction (M4-M6 cortex): 3 Total score (0-10 with 10 being normal): 10 IMPRESSION: 1. Negative head CT. 2. ASPECTS is 10. Electronically Signed   By: Marnee Spring M.D.   On: 01/12/2020 17:23    Dana Allan, MD 01/13/2020, 5:58 AM PGY-1, [facility] Family Medicine FPTS Intern pager: [phone], text pages welcome

## 2020-01-13 NOTE — Evaluation (Signed)
Occupational Therapy Evaluation Patient Details Name: Marisa Jones MRN: [id number] DOB: 27-Jun-1971 Today's Date: 01/13/2020    History of Present Illness Marisa Jones is a 49 y.o. female presenting with worsening headache and new onset left sided facial and arm numbness and weakness . PMH is significant for Hx of MI, CAD, HTN, HLD, Asthma, Bipolar Disorder, and multisubstance abuse. Pt with likely TIA and testing continues at this time.   Clinical Impression   OT evaluation initiated and completed. Pt ambulating in room with IV pole, she is receiving medication, without assistance needed. Pt demonstrated ambulation, functional transfers, and self care without need of assistance. Pt demonstrated good safety awareness throughout session and reports feeling "norma". Pt with no further concerns at this time. Pt does not need continued OT intervention. OT to SIGN OFF. Thank you for referral.     Follow Up Recommendations  No OT follow up    Equipment Recommendations  None recommended by OT    Recommendations for Other Services Other (comment)(none at this time)     Precautions / Restrictions Precautions Precautions: Fall      Mobility Bed Mobility Overal bed mobility: Independent     Transfers Overall transfer level: Independent Equipment used: None        Balance Overall balance assessment: Independent        ADL either performed or assessed with clinical judgement   ADL Overall ADL's : Modified independent     General ADL Comments: increased time - put moving IV pole around in room with ambulation and self care tasks without assistance or use of AD     Vision Baseline Vision/History: No visual deficits Patient Visual Report: No change from baseline              Pertinent Vitals/Pain Pain Assessment: No/denies pain     Hand Dominance Right   Extremity/Trunk Assessment Upper Extremity Assessment Upper Extremity Assessment: Overall WFL for tasks assessed   Lower  Extremity Assessment Lower Extremity Assessment: Overall WFL for tasks assessed       Communication Communication Communication: No difficulties   Cognition Arousal/Alertness: Awake/alert Behavior During Therapy: WFL for tasks assessed/performed Overall Cognitive Status: Within Functional Limits for tasks assessed                 Home Living Family/patient expects to be discharged to:: Shelter/Homeless      Additional Comments: Pt reports living at women's shelter      Prior Functioning/Environment Level of Independence: Independent                  AM-PAC OT "6 Clicks" Daily Activity     Outcome Measure Help from another person eating meals?: None Help from another person taking care of personal grooming?: None Help from another person toileting, which includes using toliet, bedpan, or urinal?: None Help from another person bathing (including washing, rinsing, drying)?: None Help from another person to put on and taking off regular upper body clothing?: None Help from another person to put on and taking off regular lower body clothing?: None 6 Click Score: 24   End of Session Nurse Communication: Mobility status  Activity Tolerance: Patient tolerated treatment well Patient left: in bed;with call bell/phone within reach;with bed alarm set                   Time: 1540-0867 OT Time Calculation (min): 12 min Charges:  OT General Charges $OT Visit: 1 Visit OT Evaluation $OT Eval Low Complexity: 1 Low  Jackquline Denmark P MS, OTR/L 01/13/2020, 9:29 AM

## 2020-01-13 NOTE — Progress Notes (Signed)
Family Medicine Teaching Service Daily Progress Note Intern Pager: [phone]  Patient name: Marisa Jones Medical record number: [id number] Date of birth: 06/22/1971 Age: 49 y.o. Gender: female  Primary Care Provider: [facility] Consultants: Neuro Code Status: Full  Pt Overview and Major Events to Date:  01/07-admitted  Assessment and Plan:  Marisa Jones is a 49 y.o. female presenting with worsening headache and new onset left sided facial and arm numbness and weakness . PMH is significant for Hx of MI, CAD, HTN, HLD, Asthma, Bipolar Disorder, and multisubstance abuse.  Headache and Left sided facial and arm numbness secondary to TIA Hypotensive overnight.  Echo showed LVEF 60-55%, with left ventricular normal function.  Right ventricular systolic function normal.  On exam** -Regular diet -Neurology is recommended ASA and Plavix x3 months and Plavix monotherapy, follow-up with stroke clinic NP at [facility] in 4 weeks.  Signed off 02/08 -Continue cardiac monitoring -Continue neurochecks and vitals per floor -HbA1c 5.8 -TSH 5.993, follow-up with patient -PT/OT eval-no follow-up -Consider [facility] for medication assistance  HTN Hypertensive overnight BP 89/42.  No repeat documented. -Continue to hold BP meds -Vital signs per unit  HLD. Chronic Stable -Atorvastatin 80 mg daily  Asthma. Chronic Stable. -Continue albuterol as needed  CAD with Hx of MI. Chronic, stable Denies any chest pain -Continue to monitor  AKI Resolved  Bipolar Disorder Stable -Follow-up with the [facility]. Chronic, stable. UDS positive for cocaine on admission -Avoid beta-blockers -Monitor for signs of withdrawals  Homelessness. Chronic, stable.  Patient currently living in a women's shelter - Consult to SW  FEN/GI: NPO until after SLP Prophylaxis: Lovenox  Disposition: Potential discharge today  Subjective:  No acute events  overnight.  Denies any chest pain, SOB.  No headaches or blurry vision.  States that weakness is improving.   Objective: Temp:  [98.2 F (36.8 C)-98.8 F (37.1 C)] 98.3 F (36.8 C) (02/08 1524) Pulse Rate:  [63-91] 67 (02/08 1524) Resp:  [13-19] 18 (02/08 0453) BP: (109-185)/(72-98) 126/80 (02/08 1524) SpO2:  [93 %-99 %] 96 % (02/08 1524) Weight:  [67.6 kg] 67.6 kg (02/07 1643) Physical Exam: General: 49 year old female, looks older than stated age in no acute distress Cardiovascular: RRR with no murmurs noted Respiratory: CTA bilaterally  Gastrointestinal: Bowel sounds present. No abdominal pain Extremities: no lower extremity edema Neuro: Alert and orientated x3, CN II-XII intact, sensation normal, motor normal Psych: Behavior and speech appropriate to situation Laboratory: Recent Labs  Lab 01/12/20 1651 01/12/20 1710 01/13/20 0219  WBC 9.2  --  8.1  HGB 12.9 13.3 12.4  HCT 40.1 39.0 37.8  PLT 297  --  271   Recent Labs  Lab 01/12/20 1651 01/12/20 1710 01/13/20 0219  NA 140 140 140  K 4.2 3.8 3.5  CL 107 105 103  CO2 24  --  24  BUN 15 18 11   CREATININE 1.16* 1.20* 0.76  CALCIUM 9.1  --  8.9  PROT 6.6  --  6.2*  BILITOT 0.9  --  0.9  ALKPHOS 152*  --  137*  ALT 75*  --  60*  AST 36  --  22  GLUCOSE 117* 115* 98      Imaging/Diagnostic Tests: CT Code Stroke CTA Head W/WO contrast  Result Date: 01/12/2020 CLINICAL DATA:  Left-sided arm and face numbness with tingling EXAM: CT ANGIOGRAPHY HEAD AND NECK TECHNIQUE: Multidetector CT imaging of the head and neck was performed using the standard protocol during  bolus administration of intravenous contrast. Multiplanar CT image reconstructions and MIPs were obtained to evaluate the vascular anatomy. Carotid stenosis measurements (when applicable) are obtained utilizing NASCET criteria, using the distal internal carotid diameter as the denominator. CONTRAST:  20mL OMNIPAQUE IOHEXOL 350 MG/ML SOLN COMPARISON:  CTA of the  head 07/03/2018 FINDINGS: CTA NECK FINDINGS Aortic arch: Normal.  Three vessel branching. Right carotid system: Vessels are smooth and widely patent. No atheromatous changes. Left carotid system: Vessels are smooth and widely patent. No atheromatous changes. Vertebral arteries: Minimal calcification at the origin of the right subclavian artery. The vertebral arteries are smooth and widely patent to the dura. Skeleton: Negative Other neck: Negative Upper chest: Negative Review of the MIP images confirms the above findings CTA HEAD FINDINGS Anterior circulation: No branch occlusion, beading, or flow limiting stenosis. Negative for aneurysm. There is a small infundibulum at the supraclinoid left ICA Posterior circulation: Vertebral and basilar arteries are smooth and widely patent. Cerebellar and posterior cerebral arteries are unremarkable. Negative for aneurysm. Venous sinuses: Patent Anatomic variants: None significant Review of the MIP images confirms the above findings IMPRESSION: Negative CTA of the head and neck. Electronically Signed   By: Marnee Spring M.D.   On: 01/12/2020 17:50   CT Code Stroke CTA Neck W/WO contrast  Result Date: 01/12/2020 : Please see CTA head dictation. Electronically Signed   By: Marnee Spring M.D.   On: 01/12/2020 17:59   MR BRAIN WO CONTRAST  Result Date: 01/12/2020 CLINICAL DATA:  Headache.  Numbness of the left face and arm. EXAM: MRI HEAD WITHOUT CONTRAST TECHNIQUE: Multiplanar, multiecho pulse sequences of the brain and surrounding structures were obtained without intravenous contrast. COMPARISON:  CT study same day FINDINGS: Brain: The brain has a normal appearance without evidence of malformation, atrophy, old or acute small or large vessel infarction, mass lesion, hemorrhage, hydrocephalus or extra-axial collection. Vascular: Major vessels at the base of the brain show flow. Venous sinuses appear patent. Skull and upper cervical spine: Normal. Sinuses/Orbits: Mild  mucosal thickening of the left maxillary sinus. Other sinuses clear. Orbits negative. Other: None significant. IMPRESSION: Normal study. Electronically Signed   By: Paulina Fusi M.D.   On: 01/12/2020 22:09   CT HEAD CODE STROKE WO CONTRAST  Result Date: 01/12/2020 CLINICAL DATA:  Code stroke. Left arm and face numbness and tingling. EXAM: CT HEAD WITHOUT CONTRAST TECHNIQUE: Contiguous axial images were obtained from the base of the skull through the vertex without intravenous contrast. COMPARISON:  07/03/2018 brain MRI FINDINGS: Brain: No evidence of acute infarction, hemorrhage, hydrocephalus, extra-axial collection or mass lesion/mass effect. Vascular: No hyperdense vessel or unexpected calcification. Skull: Normal. Negative for fracture or focal lesion. Sinuses/Orbits: Negative Other: These results were communicated to Dr. Otelia Limes at 5:23 pmon 2/7/2021by text page via the [facility] messaging system. ASPECTS [facility] Stroke Program Early CT Score) - Ganglionic level infarction (caudate, lentiform nuclei, internal capsule, insula, M1-M3 cortex): 7 - Supraganglionic infarction (M4-M6 cortex): 3 Total score (0-10 with 10 being normal): 10 IMPRESSION: 1. Negative head CT. 2. ASPECTS is 10. Electronically Signed   By: Marnee Spring M.D.   On: 01/12/2020 17:23    Dana Allan, MD 01/13/2020, 4:14 PM PGY-1, [facility] Family Medicine FPTS Intern pager: [phone], text pages welcome

## 2020-01-13 NOTE — Progress Notes (Addendum)
STROKE TEAM PROGRESS NOTE   INTERVAL HISTORY No family is at bedside. Pt is sitting in bed for lunch. Still complains of left face and arm numbness but better than before, not completely resolved yet. She admitted that she used cocaine only once last Friday.    Vitals:   01/12/20 2245 01/13/20 0200 01/13/20 0453 01/13/20 0747  BP: 125/75 112/72 109/76 124/80  Pulse: 67 77 71 63  Resp: 18  18   Temp: 98.4 F (36.9 C) 98.2 F (36.8 C) 98.2 F (36.8 C) 98.5 F (36.9 C)  TempSrc: Oral Oral Oral Oral  SpO2: 95% 93% 96% 96%  Weight:      Height:        CBC:  Recent Labs  Lab 01/12/20 1651 01/12/20 1651 01/12/20 1710 01/13/20 0219  WBC 9.2  --   --  8.1  NEUTROABS 5.6  --   --  4.4  HGB 12.9   < > 13.3 12.4  HCT 40.1   < > 39.0 37.8  MCV 87.6  --   --  86.1  PLT 297  --   --  271   < > = values in this interval not displayed.    Basic Metabolic Panel:  Recent Labs  Lab 01/12/20 1651 01/12/20 1651 01/12/20 1710 01/13/20 0219  NA 140   < > 140 140  K 4.2   < > 3.8 3.5  CL 107   < > 105 103  CO2 24  --   --  24  GLUCOSE 117*   < > 115* 98  BUN 15   < > 18 11  CREATININE 1.16*   < > 1.20* 0.76  CALCIUM 9.1  --   --  8.9   < > = values in this interval not displayed.   Lipid Panel:     Component Value Date/Time   CHOL 320 (H) 01/13/2020 0219   TRIG 253 (H) 01/13/2020 0219   HDL 47 01/13/2020 0219   CHOLHDL 6.8 01/13/2020 0219   VLDL 51 (H) 01/13/2020 0219   LDLCALC 222 (H) 01/13/2020 0219   HgbA1c: No results found for: HGBA1C Urine Drug Screen:     Component Value Date/Time   LABOPIA NONE DETECTED 01/12/2020 1705   COCAINSCRNUR POSITIVE (A) 01/12/2020 1705   LABBENZ NONE DETECTED 01/12/2020 1705   AMPHETMU NONE DETECTED 01/12/2020 1705   THCU NONE DETECTED 01/12/2020 1705   LABBARB NONE DETECTED 01/12/2020 1705    Alcohol Level     Component Value Date/Time   ETH <10 01/12/2020 1651    IMAGING past 48 hours CT Code Stroke CTA Head W/WO  contrast  Result Date: 01/12/2020 CLINICAL DATA:  Left-sided arm and face numbness with tingling EXAM: CT ANGIOGRAPHY HEAD AND NECK TECHNIQUE: Multidetector CT imaging of the head and neck was performed using the standard protocol during bolus administration of intravenous contrast. Multiplanar CT image reconstructions and MIPs were obtained to evaluate the vascular anatomy. Carotid stenosis measurements (when applicable) are obtained utilizing NASCET criteria, using the distal internal carotid diameter as the denominator. CONTRAST:  69mL OMNIPAQUE IOHEXOL 350 MG/ML SOLN COMPARISON:  CTA of the head 07/03/2018 FINDINGS: CTA NECK FINDINGS Aortic arch: Normal.  Three vessel branching. Right carotid system: Vessels are smooth and widely patent. No atheromatous changes. Left carotid system: Vessels are smooth and widely patent. No atheromatous changes. Vertebral arteries: Minimal calcification at the origin of the right subclavian artery. The vertebral arteries are smooth and widely patent to the dura.  Skeleton: Negative Other neck: Negative Upper chest: Negative Review of the MIP images confirms the above findings CTA HEAD FINDINGS Anterior circulation: No branch occlusion, beading, or flow limiting stenosis. Negative for aneurysm. There is a small infundibulum at the supraclinoid left ICA Posterior circulation: Vertebral and basilar arteries are smooth and widely patent. Cerebellar and posterior cerebral arteries are unremarkable. Negative for aneurysm. Venous sinuses: Patent Anatomic variants: None significant Review of the MIP images confirms the above findings IMPRESSION: Negative CTA of the head and neck. Electronically Signed   By: Monte Fantasia M.D.   On: 01/12/2020 17:50   CT Code Stroke CTA Neck W/WO contrast  Result Date: 01/12/2020 : Please see CTA head dictation. Electronically Signed   By: Monte Fantasia M.D.   On: 01/12/2020 17:59   MR BRAIN WO CONTRAST  Result Date: 01/12/2020 CLINICAL DATA:   Headache.  Numbness of the left face and arm. EXAM: MRI HEAD WITHOUT CONTRAST TECHNIQUE: Multiplanar, multiecho pulse sequences of the brain and surrounding structures were obtained without intravenous contrast. COMPARISON:  CT study same day FINDINGS: Brain: The brain has a normal appearance without evidence of malformation, atrophy, old or acute small or large vessel infarction, mass lesion, hemorrhage, hydrocephalus or extra-axial collection. Vascular: Major vessels at the base of the brain show flow. Venous sinuses appear patent. Skull and upper cervical spine: Normal. Sinuses/Orbits: Mild mucosal thickening of the left maxillary sinus. Other sinuses clear. Orbits negative. Other: None significant. IMPRESSION: Normal study. Electronically Signed   By: Nelson Chimes M.D.   On: 01/12/2020 22:09   CT HEAD CODE STROKE WO CONTRAST  Result Date: 01/12/2020 CLINICAL DATA:  Code stroke. Left arm and face numbness and tingling. EXAM: CT HEAD WITHOUT CONTRAST TECHNIQUE: Contiguous axial images were obtained from the base of the skull through the vertex without intravenous contrast. COMPARISON:  07/03/2018 brain MRI FINDINGS: Brain: No evidence of acute infarction, hemorrhage, hydrocephalus, extra-axial collection or mass lesion/mass effect. Vascular: No hyperdense vessel or unexpected calcification. Skull: Normal. Negative for fracture or focal lesion. Sinuses/Orbits: Negative Other: These results were communicated to Dr. Cheral Marker at 5:23 pmon 2/7/2021by text page via the [facility] messaging system. ASPECTS [facility] Stroke Program Early CT Score) - Ganglionic level infarction (caudate, lentiform nuclei, internal capsule, insula, M1-M3 cortex): 7 - Supraganglionic infarction (M4-M6 cortex): 3 Total score (0-10 with 10 being normal): 10 IMPRESSION: 1. Negative head CT. 2. ASPECTS is 10. Electronically Signed   By: Monte Fantasia M.D.   On: 01/12/2020 17:23    PHYSICAL EXAM  Temp:  [98.2 F (36.8 C)-98.8 F (37.1 C)]  98.5 F (36.9 C) (02/08 0747) Pulse Rate:  [63-91] 63 (02/08 0747) Resp:  [13-19] 18 (02/08 0453) BP: (109-185)/(72-98) 124/80 (02/08 0747) SpO2:  [93 %-99 %] 96 % (02/08 0747) Weight:  [67.6 kg] 67.6 kg (02/07 1643)  General - Well nourished, well developed, in no apparent distress.  Ophthalmologic - fundi not visualized due to noncooperation.  Cardiovascular - Regular rhythm and rate.  Mental Status -  Level of arousal and orientation to time, place, and person were intact. Language including expression, naming, repetition, comprehension was assessed and found intact.  Cranial Nerves II - XII - II - Visual field intact OU. III, IV, VI - Extraocular movements intact. V - Facial sensation decreased on the left, about 80% of the right. VII - Facial movement intact bilaterally. VIII - Hearing & vestibular intact bilaterally. X - Palate elevates symmetrically. XI - Chin turning & shoulder shrug intact bilaterally.  XII - Tongue protrusion intact.  Motor Strength - The patient's strength was normal in all extremities and pronator drift was absent.  Bulk was normal and fasciculations were absent.   Motor Tone - Muscle tone was assessed at the neck and appendages and was normal.  Reflexes - The patient's reflexes were symmetrical in all extremities and she had no pathological reflexes.  Sensory - Light touch, temperature/pinprick were assessed and were decreased on the left UE, 80% of the right.    Coordination - The patient had normal movements in the hands with no ataxia or dysmetria.  Tremor was absent.  Gait and Station - deferred.   ASSESSMENT/PLAN Ms. Concha Baksh is a 49 y.o. female with history of HTN presenting with HA followed by L arm/face numbness and tingling, blurred vision  TIA - risk factor including HTN, HLD, smoker and cocaine use   CT head No acute abnormality. ASPECTS 10.     CTA head & neck negative   MRI  normal  2D Echo pending  LDL 222  HgbA1c  pending   Lovenox 40 mg sq daily for VTE prophylaxis  aspirin 81 mg daily prior to admission, now on aspirin 325 mg daily. Recommend ASA 81 and plavix 75 DAPT for 3 months and then plavix alone    Therapy recommendations:  None   Disposition:  Return to shelter  Hypertension  Stable . BP goal normotensive  Hyperlipidemia  Home meds:  lipitor 40  Now on lipitor 80  LDL 222  Continue statin at discharge  Cocaine abuse  Pt admitted cocaine use just one time last Friday  UDS positive for cocaine  Cessation education provided  Pt is willing to quit  Tobacco abuse  Current smoker  Smoking cessation counseling provided  Pt is willing to quit  Other Stroke Risk Factors  ETOH use, alcohol level <10, advised to drink no more than 1 drink(s) a day  Coronary artery disease, hx MI  Other Active Problems  AKI, resolved  Asthma, chronic  Bipolar d/o on prozac and haldol  Homeless, at [facility] day # 1  Neurology will sign off. Please call with questions. Pt will follow up with stroke clinic NP at [facility] in about 4 weeks. Thanks for the consult.  Marvel Plan, MD PhD Stroke Neurology 01/13/2020 11:44 AM  To contact Stroke Continuity provider, please refer to [email or web address]. After hours, contact General Neurology

## 2020-01-14 DIAGNOSIS — R202 Paresthesia of skin: Secondary | ICD-10-CM | POA: Diagnosis not present

## 2020-01-14 DIAGNOSIS — G459 Transient cerebral ischemic attack, unspecified: Secondary | ICD-10-CM | POA: Diagnosis not present

## 2020-01-14 LAB — BASIC METABOLIC PANEL
Anion gap: 13 (ref 5–15)
BUN: 19 mg/dL (ref 6–20)
CO2: 21 mmol/L — ABNORMAL LOW (ref 22–32)
Calcium: 9 mg/dL (ref 8.9–10.3)
Chloride: 105 mmol/L (ref 98–111)
Creatinine, Ser: 0.75 mg/dL (ref 0.44–1.00)
GFR calc Af Amer: >60 mL/min (ref >60)
GFR calc non Af Amer: >60 mL/min (ref >60)
Glucose, Bld: 106 mg/dL — ABNORMAL HIGH (ref 70–99)
Potassium: 4 mmol/L (ref 3.5–5.1)
Sodium: 139 mmol/L (ref 135–145)

## 2020-01-14 LAB — TSH: TSH: 5.993 u[IU]/mL — ABNORMAL HIGH (ref 0.350–4.500)

## 2020-01-14 MED ORDER — CLOPIDOGREL BISULFATE 75 MG PO TABS: 75.0000 mg | ORAL_TABLET | Freq: Every day | ORAL | 3 refills | Status: DC

## 2020-01-14 MED ORDER — SENNOSIDES-DOCUSATE SODIUM 8.6-50 MG PO TABS: 1.0000 | ORAL_TABLET | Freq: Every evening | ORAL | 0 refills | Status: DC | PRN

## 2020-01-14 MED ORDER — ATORVASTATIN CALCIUM 80 MG PO TABS: 80.0000 mg | ORAL_TABLET | Freq: Every day | ORAL | 3 refills | Status: DC

## 2020-01-14 MED FILL — SENEXON-S 8.6-50 MG TABS: 8.6-50 | 90 days supply | Qty: 90 | Fill #0

## 2020-01-14 MED FILL — ATORVASTATIN CALCIUM 80 MG: 80 | 90 days supply | Qty: 90 | Fill #0

## 2020-01-14 MED FILL — CLOPIDOGREL 75 MG TABLET: 75 | 90 days supply | Qty: 90 | Fill #0

## 2020-01-14 NOTE — Discharge Instructions (Signed)
Transient Ischemic Attack  A transient ischemic attack (TIA) is a "warning stroke" that causes stroke-like symptoms that go away quickly. A TIA does not cause lasting damage to the brain. But having a TIA is a sign that you may be at risk for a stroke. Lifestyle changes and medical treatments can help prevent a stroke. It is important to know the symptoms of a TIA and what to do. Get help right away, even if your symptoms go away. The symptoms of a TIA are the same as those of a stroke. They can happen fast, and they usually go away within minutes or hours. They can include:  Weakness or loss of feeling in your face, arm, or leg. This often happens on one side of your body.  Trouble walking.  Trouble moving your arms or legs.  Trouble talking or understanding what people are saying.  Trouble seeing.  Seeing two of one object (double vision).  Feeling dizzy.  Feeling confused.  Loss of balance or coordination.  Feeling sick to your stomach (nauseous) and throwing up (vomiting).  A very bad headache for no reason. What increases the risk? Certain things may make you more likely to have a TIA. Some of these are things that you can change, such as:  Being very overweight (obese).  Using products that contain nicotine or tobacco, such as cigarettes and e-cigarettes.  Taking birth control pills.  Not being active.  Drinking too much alcohol.  Using drugs. Other risk factors include:  Having an irregular heartbeat (atrial fibrillation).  Being African American or Hispanic.  Having had blood clots, stroke, TIA, or heart attack in the past.  Being a woman with a history of high blood pressure in pregnancy (preeclampsia).  Being over the age of 59.  Being female.  Having family history of stroke.  Having the following diseases or conditions: ? High blood pressure. ? High cholesterol. ? Diabetes. ? Heart disease. ? Sickle cell disease. ? Sleep apnea. ? Migraine  headache. ? Long-term (chronic) diseases that cause soreness and swelling (inflammation). ? Disorders that affect how your blood clots. Follow these instructions at home: Medicines   Take over-the-counter and prescription medicines only as told by your doctor.  If you were told to take aspirin or another medicine to thin your blood, take it exactly as told by your doctor. ? Taking too much of the medicine can cause bleeding. ? Taking too little of the medicine may not work to treat the problem. Eating and drinking   Eat 5 or more servings of fruits and vegetables each day.  Follow instructions from your doctor about your diet. You may need to follow a certain diet to help lower your risk of having a stroke. You may need to: ? Eat a diet that is low in fat and salt. ? Eat foods that contain a lot of fiber. ? Limit the amount of carbohydrates and sugar in your diet.  Limit alcohol intake to 1 drink a day for nonpregnant women and 2 drinks a day for men. One drink equals 12 oz of beer, 5 oz of wine, or 1 oz of hard liquor. General instructions  Keep a healthy weight.  Stay active. Try to get at least 30 minutes of activity on all or most days.  Find out if you have a condition called sleep apnea. Get treatment if needed.  Do not use any products that contain nicotine or tobacco, such as cigarettes and e-cigarettes. If you need help  quitting, ask your doctor.  Do not abuse drugs.  Keep all follow-up visits as told by your doctor. This is important. Get help right away if:  You have any signs of stroke. "BE FAST" is an easy way to remember the main warning signs: ? B - Balance. Signs are dizziness, sudden trouble walking, or loss of balance. ? E - Eyes. Signs are trouble seeing or a sudden change in how you see. ? F - Face. Signs are sudden weakness or loss of feeling of the face, or the face or eyelid drooping on one side. ? A - Arms. Signs are weakness or loss of feeling in an  arm. This happens suddenly and usually on one side of the body. ? S - Speech. Signs are sudden trouble speaking, slurred speech, or trouble understanding what people say. ? T - Time. Time to call emergency services. Write down what time symptoms started.  You have other signs of stroke, such as: ? A sudden, very bad headache with no known cause. ? Feeling sick to your stomach (nausea). ? Throwing up (vomiting). ? Jerky movements that you cannot control (seizure). These symptoms may be an emergency. Do not wait to see if the symptoms will go away. Get medical help right away. Call your local emergency services (911 in the [place] Do not drive yourself to the hospital. Summary  A transient ischemic attack (TIA) is a "warning stroke" that causes stroke-like symptoms that go away quickly.  A TIA is a medical emergency. Get help right away, even if your symptoms go away.  A TIA does not cause lasting damage to the brain.  Having a TIA is a sign that you may be at risk for a stroke. Lifestyle changes and medical treatments can help prevent a stroke. This information is not intended to replace advice given to you by your health care provider. Make sure you discuss any questions you have with your health care provider. Document Revised: 08/17/2018 Document Reviewed: 02/22/2017 Elsevier Patient Education  2020 Elsevier Inc. Clopidogrel tablets What is this medicine? CLOPIDOGREL (kloh PID oh grel) helps to prevent blood clots. This medicine is used to prevent heart attack, stroke, or other vascular events in people who are at high risk. This medicine may be used for other purposes; ask your health care provider or pharmacist if you have questions. COMMON BRAND NAME(S): Plavix What should I tell my health care provider before I take this medicine? They need to know if you have any of the following conditions:  bleeding disorders  bleeding in the brain  having surgery  history of stomach  bleeding  an unusual or allergic reaction to clopidogrel, other medicines, foods, dyes, or preservatives  pregnant or trying to get pregnant  breast-feeding How should I use this medicine? Take this medicine by mouth with a glass of water. Follow the directions on the prescription label. You may take this medicine with or without food. If it upsets your stomach, take it with food. Take your medicine at regular intervals. Do not take it more often than directed. Do not stop taking except on your doctor's advice. A special MedGuide will be given to you by the pharmacist with each prescription and refill. Be sure to read this information carefully each time. Talk to your pediatrician regarding the use of this medicine in children. Special care may be needed. Overdosage: If you think you have taken too much of this medicine contact a poison control center or emergency room at  once. NOTE: This medicine is only for you. Do not share this medicine with others. What if I miss a dose? If you miss a dose, take it as soon as you can. If it is almost time for your next dose, take only that dose. Do not take double or extra doses. What may interact with this medicine? Do not take this medicine with the following medications:  dasabuvir; ombitasvir; paritaprevir; ritonavir  defibrotide  selexipag This medicine may also interact with the following medications:  certain medicines that treat or prevent blood clots like warfarin  narcotic medicines for pain  NSAIDs, medicines for pain and inflammation, like ibuprofen or naproxen  repaglinide  SNRIs, medicines for depression, like desvenlafaxine, duloxetine, levomilnacipran, venlafaxine  SSRIs, medicines for depression, like citalopram, escitalopram, fluoxetine, fluvoxamine, paroxetine, sertraline  stomach acid blockers like cimetidine, esomeprazole, omeprazole This list may not describe all possible interactions. Give your health care provider a  list of all the medicines, herbs, non-prescription drugs, or dietary supplements you use. Also tell them if you smoke, drink alcohol, or use illegal drugs. Some items may interact with your medicine. What should I watch for while using this medicine? Visit your doctor or health care professional for regular check-ups. Do not stop taking your medicine unless your doctor tells you to. Notify your doctor or health care professional and seek emergency treatment if you develop breathing problems; changes in vision; chest pain; severe, sudden headache; pain, swelling, warmth in the leg; trouble speaking; sudden numbness or weakness of the face, arm or leg. These can be signs that your condition has gotten worse. If you are going to have surgery or dental work, tell your doctor or health care professional that you are taking this medicine. Certain genetic factors may reduce the effect of this medicine. Your doctor may use genetic tests to determine treatment. Only take aspirin if you are instructed to. Low doses of aspirin are used with this medicine to treat some conditions. Taking aspirin with this medicine can increase your risk of bleeding so you must be careful. Talk to your doctor or pharmacist if you have questions. What side effects may I notice from receiving this medicine? Side effects that you should report to your doctor or health care professional as soon as possible:  allergic reactions like skin rash, itching or hives, swelling of the face, lips, or tongue  signs and symptoms of bleeding such as bloody or black, tarry stools; red or dark-brown urine; spitting up blood or brown material that looks like coffee grounds; red spots on the skin; unusual bruising or bleeding from the eye, gums, or nose  signs and symptoms of a blood clot such as breathing problems; changes in vision; chest pain; severe, sudden headache; pain, swelling, warmth in the leg; trouble speaking; sudden numbness or weakness  of the face, arm or leg  signs and symptoms of low blood sugar such as feeling anxious; confusion; dizziness; increased hunger; unusually weak or tired; increased sweating; shakiness; cold, clammy skin; irritable; headache; blurred vision; fast heartbeat; loss of consciousness Side effects that usually do not require medical attention (report to your doctor or health care professional if they continue or are bothersome):  constipation  diarrhea  headache  upset stomach This list may not describe all possible side effects. Call your doctor for medical advice about side effects. You may report side effects to FDA at 1-800-FDA-1088. Where should I keep my medicine? Keep out of the reach of children. Store at room temperature  of 59 to 86 degrees F (15 to 30 degrees C). Throw away any unused medicine after the expiration date. NOTE: This sheet is a summary. It may not cover all possible information. If you have questions about this medicine, talk to your doctor, pharmacist, or health care provider.  2020 Elsevier/Gold Standard (2018-04-23 15:03:38)    Your will be started on Plavix and Aspirin. You need to take both of these for three months.  At the beginning of the fourth month then stop the Aspirin and continue the Plavix daily.  If you cannot afford the Plavix then continue to take the Aspirin daily.

## 2020-01-14 NOTE — Progress Notes (Signed)
New order to discharge patient.  PIVs discontinued.  Tele discontinued.  AVS completed, reviewed and given to patient.  Taxi voucher given to patient to transport back [place] [facility] [place].

## 2020-02-18 ENCOUNTER — Inpatient Hospital Stay: Payer: Medicaid Other | Admitting: Adult Health

## 2020-02-18 NOTE — Progress Notes (Deleted)
[facility] [address] [place] [address] [phone]       HOSPITAL FOLLOW UP NOTE  Ms. Anelisse Maroney Date of Birth:  May 26, 1971 Medical Record Number:  [id number]   Reason for Referral:  hospital stroke follow up    CHIEF COMPLAINT:  No chief complaint on file.   HPI: Hilary Milks being seen today for in office hospital follow-up regarding TIA on 01/12/2020.  History obtained from *** and chart review. Reviewed all radiology images and labs personally.  Ms. Indiah Heyden is a 49 y.o. female with history of HTN  who presented on 01/12/2020 with HA followed by L arm/face numbness and tingling, blurred vision.  Evaluated by stroke team and Dr. Erlinda Hong with stroke work-up largely unremarkable and symptoms likely TIA with risk factors including HTN, HLD, tobacco use and recent cocaine use.  Recommended DAPT for 3 weeks and Plavix alone as previously on aspirin.  History of HTN stable.  LDL 222 and recommended increasing home dose atorvastatin from 40 mg to 80 mg daily.  UDS positive for cocaine with cessation education provided.  Current tobacco use with smoking cessation counseling provided.  Other stroke risk factors include EtOH use and CAD with hx of MI but no prior history of stroke.  Other active problems include AKI (resolved), asthma, bipolar disorder on Prozac and Haldol and [facility]  She was discharged home in stable condition without therapy needs.     ROS:   14 system review of systems performed and negative with exception of ***  PMH:  Past Medical History:  Diagnosis Date  . Coronary artery disease   . Diabetes mellitus without complication ([place])   . Hypertension   . MI (myocardial infarction) ([place])   . Renal disorder     PSH:  Past Surgical History:  Procedure Laterality Date  . ABDOMINAL HYSTERECTOMY      Social History:  Social History   Socioeconomic History  . Marital status: Married    Spouse name:  Not on file  . Number of children: Not on file  . Years of education: Not on file  . Highest education level: Not on file  Occupational History  . Not on file  Tobacco Use  . Smoking status: Current Every Day Smoker    Packs/day: 0.50    Types: Cigarettes  . Smokeless tobacco: Never Used  Substance and Sexual Activity  . Alcohol use: Yes    Comment: occassionally  . Drug use: Never  . Sexual activity: Not on file  Other Topics Concern  . Not on file  Social History Narrative  . Not on file   Social Determinants of Health   Financial Resource Strain:   . Difficulty of Paying Living Expenses:   Food Insecurity:   . Worried About Charity fundraiser in the Last Year:   . Arboriculturist in the Last Year:   Transportation Needs:   . Film/video editor (Medical):   Marland Kitchen Lack of Transportation (Non-Medical):   Physical Activity:   . Days of Exercise per Week:   . Minutes of Exercise per Session:   Stress:   . Feeling of Stress :   Social Connections:   . Frequency of Communication with Friends and Family:   . Frequency of Social Gatherings with Friends and Family:   . Attends Religious Services:   . Active Member of Clubs or Organizations:   . Attends Archivist Meetings:   .  Marital Status:   Intimate Partner Violence:   . Fear of Current or Ex-Partner:   . Emotionally Abused:   Marland Kitchen Physically Abused:   . Sexually Abused:     Family History: No family history on file.  Medications:   Current Outpatient Medications on File Prior to Visit  Medication Sig Dispense Refill  . albuterol (PROAIR HFA) 108 (90 Base) MCG/ACT inhaler Inhale 1 puff into the lungs every 6 (six) hours as needed for wheezing.    Marland Kitchen amLODipine (NORVASC) 10 MG tablet Take 10 mg by mouth daily.    Marland Kitchen aspirin 81 MG EC tablet Take 81 mg by mouth daily.    Marland Kitchen atorvastatin (LIPITOR) 80 MG tablet Take 1 tablet (80 mg total) by mouth daily at 6 PM. 90 tablet 3  . clopidogrel (PLAVIX) 75 MG tablet  Take 1 tablet (75 mg total) by mouth daily. 90 tablet 3  . FLUoxetine (PROZAC) 10 MG capsule Take 10 mg by mouth daily.    Marland Kitchen gabapentin (NEURONTIN) 600 MG tablet Take 1,200 mg by mouth 3 (three) times daily.    . haloperidol (HALDOL) 2 MG tablet Take 2 mg by mouth 2 (two) times daily.    . nitroGLYCERIN (NITROSTAT) 0.4 MG SL tablet Place 0.4 mg under the tongue every 5 (five) minutes as needed for chest pain.     Marland Kitchen oxyCODONE-acetaminophen (PERCOCET) 10-325 MG tablet Take 1 tablet by mouth 3 (three) times daily.    Marland Kitchen senna-docusate (SENOKOT-S) 8.6-50 MG tablet Take 1 tablet by mouth at bedtime as needed for mild constipation. 90 tablet 0  . trazodone (DESYREL) 300 MG tablet Take 300 mg by mouth at bedtime.     No current facility-administered medications on file prior to visit.    Allergies:   Allergies  Allergen Reactions  . Fish Allergy Anaphylaxis  . Lisinopril Swelling    Lip swelling     Physical Exam  There were no vitals filed for this visit. There is no height or weight on file to calculate BMI. No exam data present  No flowsheet data found.   General: well developed, well nourished, seated, in no evident distress Head: head normocephalic and atraumatic.   Neck: supple with no carotid or supraclavicular bruits Cardiovascular: regular rate and rhythm, no murmurs Musculoskeletal: no deformity Skin:  no rash/petichiae Vascular:  Normal pulses all extremities   Neurologic Exam Mental Status: Awake and fully alert. Oriented to place and time. Recent and remote memory intact. Attention span, concentration and fund of knowledge appropriate. Mood and affect appropriate.  Cranial Nerves: Fundoscopic exam reveals sharp disc margins. Pupils equal, briskly reactive to light. Extraocular movements full without nystagmus. Visual fields full to confrontation. Hearing intact. Facial sensation intact. Face, tongue, palate moves normally and symmetrically.  Motor: Normal bulk and tone.  Normal strength in all tested extremity muscles. Sensory.: intact to touch , pinprick , position and vibratory sensation.  Coordination: Rapid alternating movements normal in all extremities. Finger-to-nose and heel-to-shin performed accurately bilaterally. Gait and Station: Arises from chair without difficulty. Stance is normal. Gait demonstrates normal stride length and balance Reflexes: 1+ and symmetric. Toes downgoing.     NIHSS  *** Modified [place]  ***    Diagnostic Data (Labs, Imaging, Testing)   CT head No acute abnormality. ASPECTS 10.     CTA head & neck negative   MRI  normal  2D Echo EF 60 to 65%  LDL 222  HgbA1c 5.8    ASSESSMENT: Merian Capron  is a 49 y.o. year old female presented with headache following left arm/face numbness/tingling and blurred vision on 01/12/2020 with stroke work-up largely unremarkable and likely TIA with vascular risk factors including HTN, HLD, tobacco use, EtOH use, CAD and recent cocaine use.     PLAN:  1. *** : Continue {anticoagulants:31417}  and ***  for secondary stroke prevention. Maintain strict control of hypertension with blood pressure goal below 130/90, diabetes with hemoglobin A1c goal below 6.5% and cholesterol with LDL cholesterol (bad cholesterol) goal below 70 mg/dL.  I also advised the patient to eat a healthy diet with plenty of whole grains, cereals, fruits and vegetables, exercise regularly with at least 30 minutes of continuous activity daily and maintain ideal body weight. 2. HTN: Advised to continue current treatment regimen.  Today's BP ***.  Advised to continue to monitor at home along with continued follow-up with PCP for management 3. HLD: Advised to continue current treatment regimen along with continued follow-up with PCP for future prescribing and monitoring of lipid panel 4. DMII: Advised to continue to monitor glucose levels at home along with continued follow-up with PCP for management and  monitoring    Follow up in *** or call earlier if needed   Greater than 50% of time during this 45 minute visit was spent on counseling, explanation of diagnosis of ***, reviewing risk factor management of ***, planning of further management along with potential future management, and discussion with patient and family answering all questions.    Ihor Austin, AGNP-BC  [facility] [address] [place], [place] [address]  Phone [phone] Fax [phone] Note: This document was prepared with digital dictation and possible smart phrase technology. Any transcriptional errors that result from this process are unintentional.

## 2020-02-24 ENCOUNTER — Encounter: Payer: Self-pay | Admitting: Adult Health

## 2020-08-29 LAB — GLUCOSE, POCT (MANUAL RESULT ENTRY): POC Glucose: 141 mg/dl — AB (ref 70–99)

## 2021-10-15 IMAGING — CT CT HEAD CODE STROKE
3 series · 15 of 47 positions shown, 18 images · non-contrast
Comparison: 07/03/2018 brain MRI

CLINICAL DATA: Code stroke. Left arm and face numbness and
tingling.

EXAM:
CT HEAD WITHOUT CONTRAST
TECHNIQUE: Contiguous axial images were obtained from the base of the skull
through the vertex without intravenous contrast.

[Series 3: head 5.0 st · axial · 0.42mm/px · z∈[-123,+2]mm · 9 of 30 slices shown, 12 images]
[im 3/30  brain]
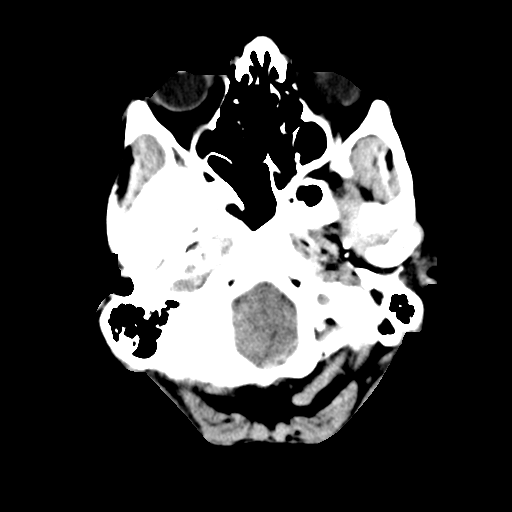
[im 3/30  bone]
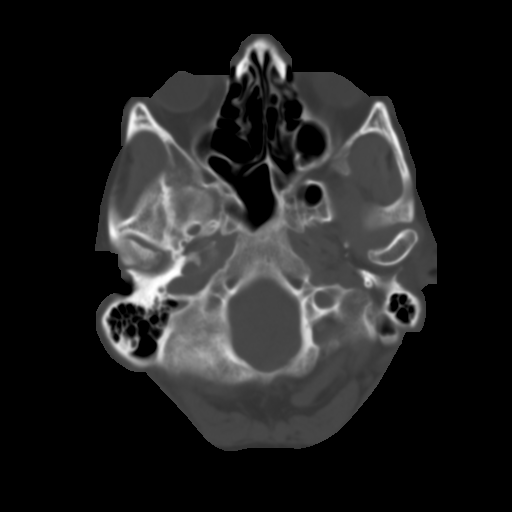
[im 6/30  brain]
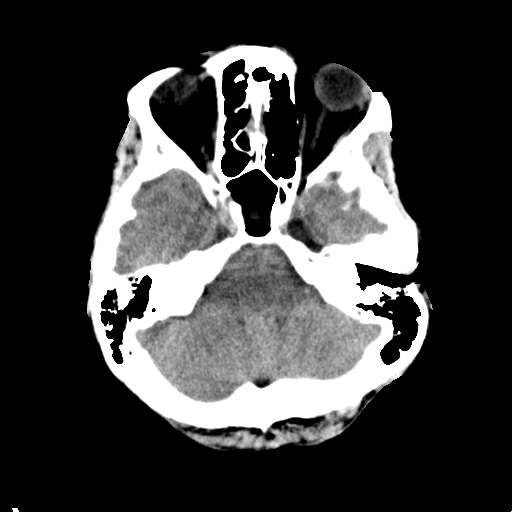
[im 9/30  brain]
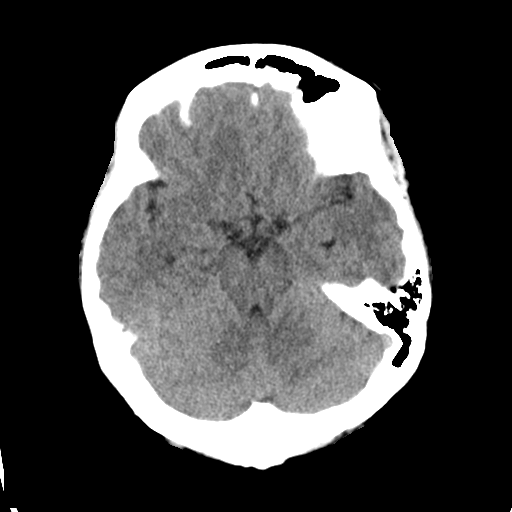
[im 12/30  brain]
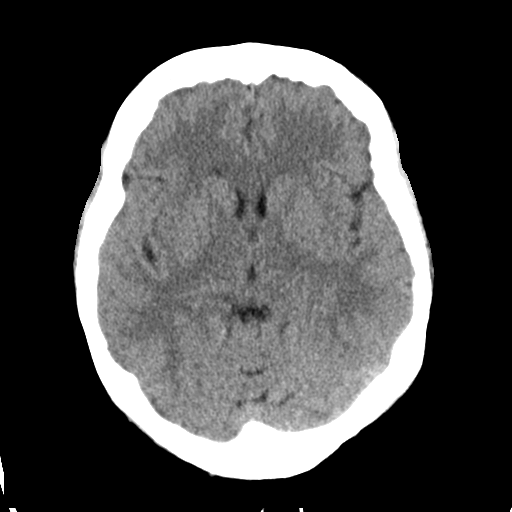
[im 16/30  brain]
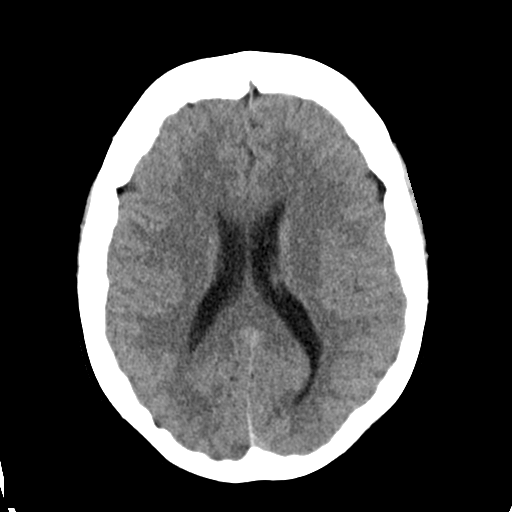
[im 16/30  bone]
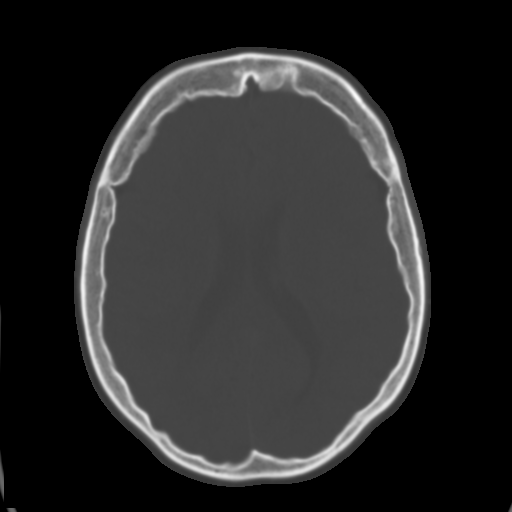
[im 19/30  brain]
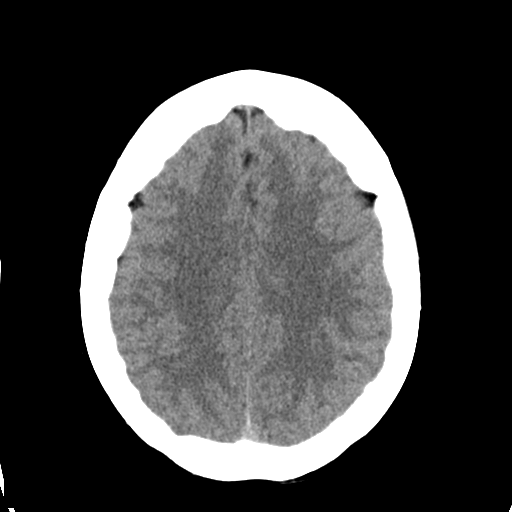
[im 22/30  brain]
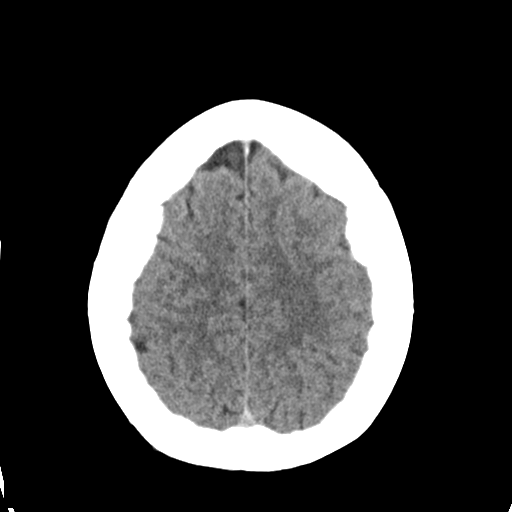
[im 25/30  brain]
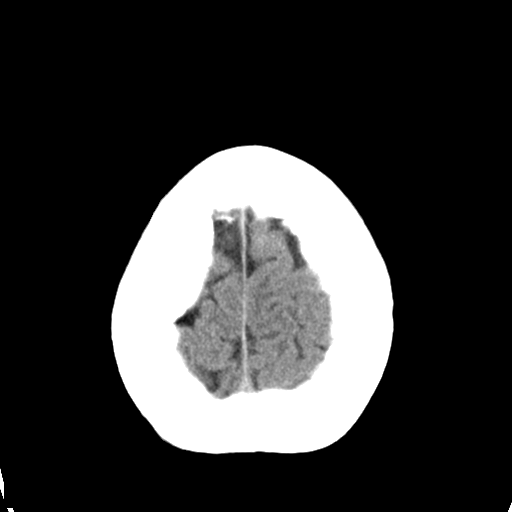
[im 28/30  brain]
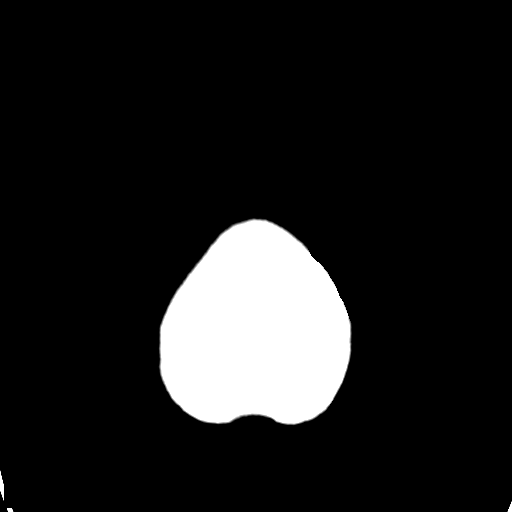
[im 28/30  bone]
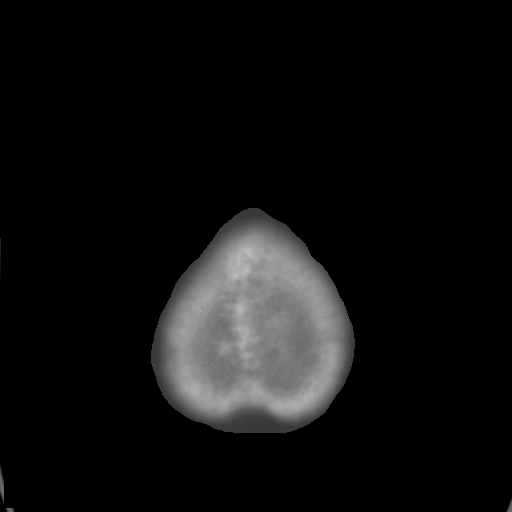

[Series 5: head 3.0 cor st · coronal · 0.33mm/px · 3 of 65 slices shown]
[im 22/65  brain]
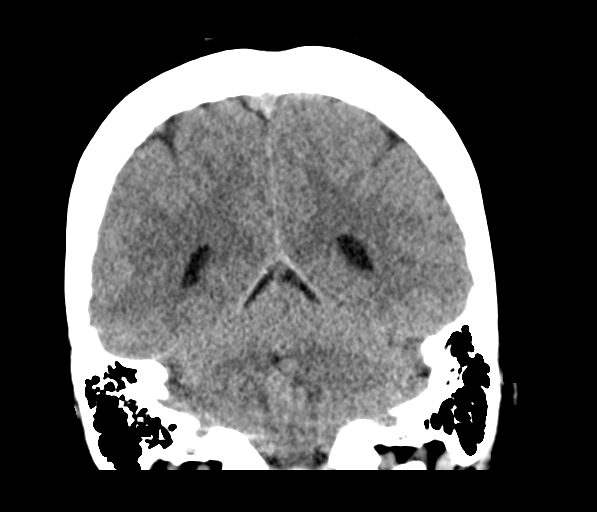
[im 29/65  brain]
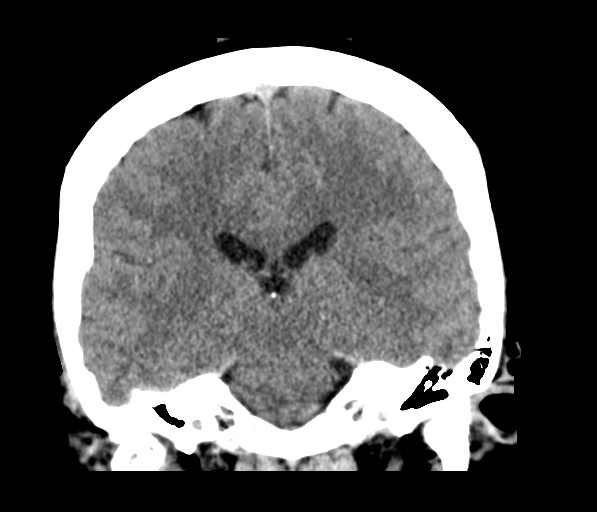
[im 36/65  brain]
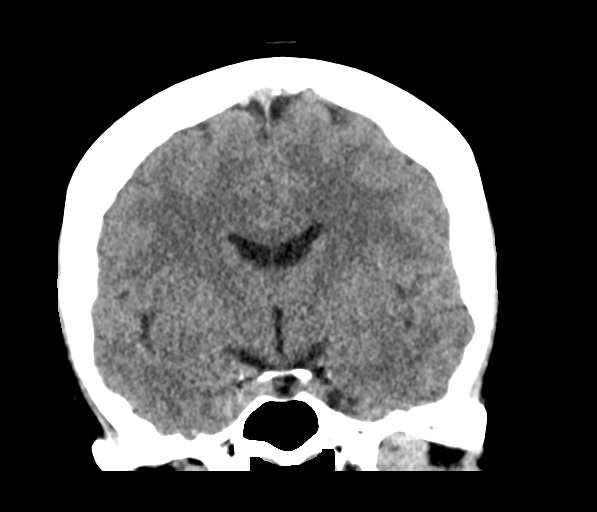

[Series 6: head 3.0 sag st · sagittal · 0.32mm/px · 3 of 55 slices shown]
[im 19/55  brain]
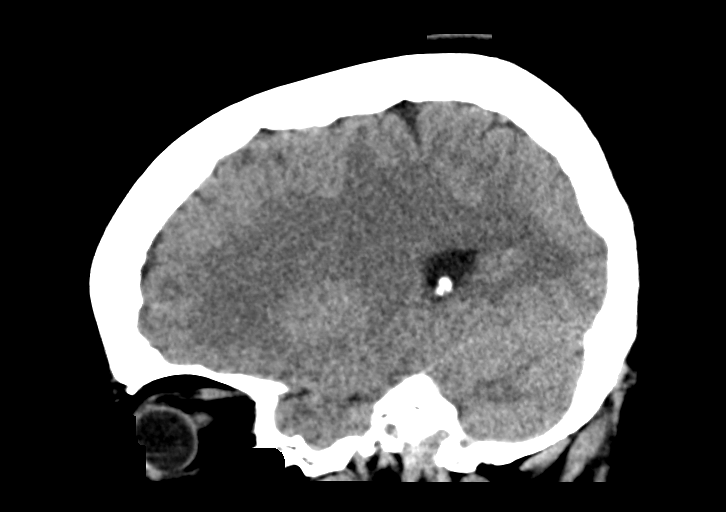
[im 28/55  brain]
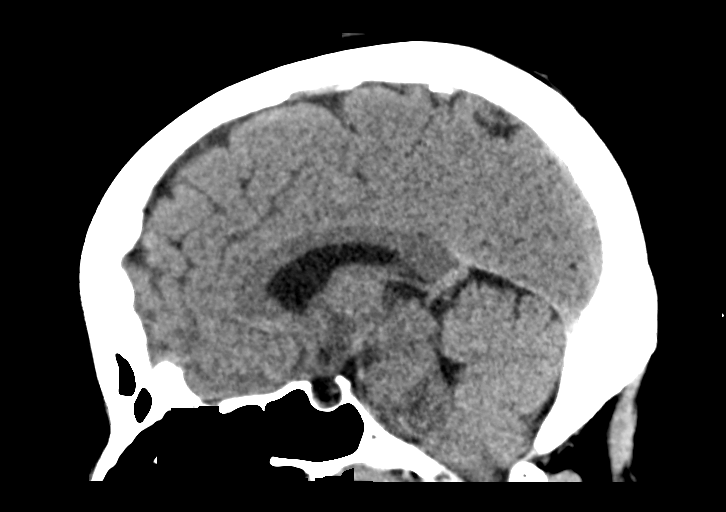
[im 37/55  brain]
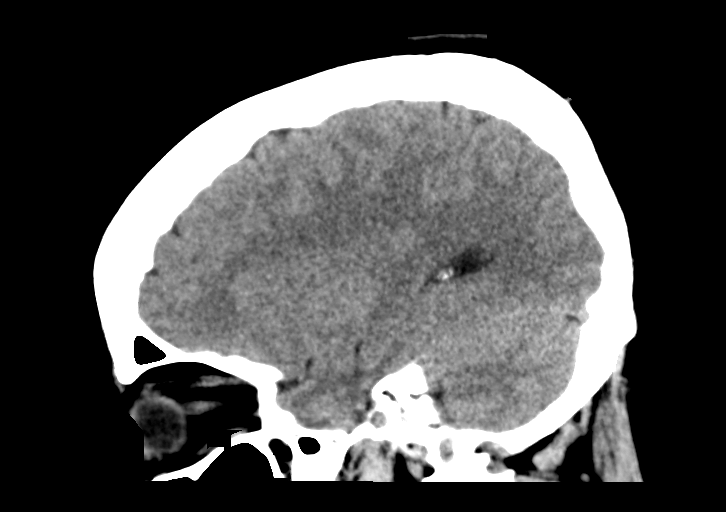

[15 of 47 positions shown; findings below may reference images not displayed]

FINDINGS: Brain: No evidence of acute infarction, hemorrhage, hydrocephalus,
extra-axial collection or mass lesion/mass effect.

Vascular: No hyperdense vessel or unexpected calcification.

Skull: Normal. Negative for fracture or focal lesion.

Sinuses/Orbits: Negative

Other: These results were communicated to Dr. Zelaya at [DATE] pmon
01/12/2020by text page via the AMION messaging system.

ASPECTS ([place] Stroke Program Early CT Score)

- Ganglionic level infarction (caudate, lentiform nuclei, internal
capsule, insula, M1-M3 cortex): 7

- Supraganglionic infarction (M4-M6 cortex): 3

Total score (0-10 with 10 being normal): 10
IMPRESSION: 1. Negative head CT.
2. ASPECTS is 10.

## 2022-11-14 ENCOUNTER — Ambulatory Visit: Payer: Medicaid Other | Admitting: Physician Assistant

## 2022-11-14 VITALS — BP 148/95 | HR 88 | Ht 62.0 in | Wt 159.0 lb

## 2022-11-14 DIAGNOSIS — F1410 Cocaine abuse, uncomplicated: Secondary | ICD-10-CM

## 2022-11-14 DIAGNOSIS — I10 Essential (primary) hypertension: Secondary | ICD-10-CM

## 2022-11-14 DIAGNOSIS — F411 Generalized anxiety disorder: Secondary | ICD-10-CM | POA: Diagnosis not present

## 2022-11-14 DIAGNOSIS — F3175 Bipolar disorder, in partial remission, most recent episode depressed: Secondary | ICD-10-CM | POA: Diagnosis not present

## 2022-11-14 DIAGNOSIS — F4310 Post-traumatic stress disorder, unspecified: Secondary | ICD-10-CM | POA: Diagnosis not present

## 2022-11-14 DIAGNOSIS — F331 Major depressive disorder, recurrent, moderate: Secondary | ICD-10-CM

## 2022-11-14 DIAGNOSIS — E039 Hypothyroidism, unspecified: Secondary | ICD-10-CM

## 2022-11-14 DIAGNOSIS — Z23 Encounter for immunization: Secondary | ICD-10-CM

## 2022-11-14 DIAGNOSIS — Z720 Tobacco use: Secondary | ICD-10-CM

## 2022-11-14 DIAGNOSIS — F17210 Nicotine dependence, cigarettes, uncomplicated: Secondary | ICD-10-CM

## 2022-11-14 DIAGNOSIS — Z8673 Personal history of transient ischemic attack (TIA), and cerebral infarction without residual deficits: Secondary | ICD-10-CM

## 2022-11-14 DIAGNOSIS — K219 Gastro-esophageal reflux disease without esophagitis: Secondary | ICD-10-CM

## 2022-11-14 DIAGNOSIS — G5601 Carpal tunnel syndrome, right upper limb: Secondary | ICD-10-CM

## 2022-11-14 MED ORDER — OMEPRAZOLE 20 MG PO CPDR: 20.0000 mg | DELAYED_RELEASE_CAPSULE | Freq: Every day | ORAL | 1 refills | Status: AC

## 2022-11-14 MED ORDER — FLUOXETINE HCL 10 MG PO CAPS: 10.0000 mg | ORAL_CAPSULE | Freq: Every day | ORAL | 1 refills | Status: AC

## 2022-11-14 MED ORDER — GABAPENTIN 300 MG PO CAPS: 600.0000 mg | ORAL_CAPSULE | Freq: Three times a day (TID) | ORAL | 1 refills | Status: DC

## 2022-11-14 MED ORDER — AMLODIPINE BESYLATE 10 MG PO TABS: 10.0000 mg | ORAL_TABLET | Freq: Every day | ORAL | 1 refills | Status: DC

## 2022-11-14 MED ORDER — LEVOTHYROXINE SODIUM 75 MCG PO TABS: 75.0000 ug | ORAL_TABLET | Freq: Every day | ORAL | 1 refills | Status: AC

## 2022-11-14 MED ORDER — CLOPIDOGREL BISULFATE 75 MG PO TABS: 75.0000 mg | ORAL_TABLET | Freq: Every day | ORAL | 1 refills | Status: AC

## 2022-11-14 MED ORDER — METOPROLOL SUCCINATE ER 25 MG PO TB24: 25.0000 mg | ORAL_TABLET | Freq: Every day | ORAL | 1 refills | Status: DC

## 2022-11-14 MED ORDER — TRAZODONE HCL 100 MG PO TABS: 200.0000 mg | ORAL_TABLET | Freq: Every day | ORAL | 1 refills | Status: DC

## 2022-11-14 NOTE — Patient Instructions (Addendum)
Your blood pressure is elevated today.  I encourage you to check your blood pressure on a daily basis, keep a written log and have available for all office visits.  To help improve your sleep, you will increase trazodone to 100 mg at bedtime.  To help with your anxiety and depressed moods, you are going to restart Prozac 10 mg once daily.  To help with your carpal tunnel, I do encourage you to do daily stretching, use cold compresses on affected area, use bracing as able.  Roney Jaffe, PA-C Physician Assistant [facility] [email or web address]   How to Take Your Blood Pressure Blood pressure is a measurement of how strongly your blood is pressing against the walls of your arteries. Arteries are blood vessels that carry blood from your heart throughout your body. Your health care provider takes your blood pressure at each office visit. You can also take your own blood pressure at home with a blood pressure monitor. You may need to take your own blood pressure to: Confirm a diagnosis of high blood pressure (hypertension). Monitor your blood pressure over time. Make sure your blood pressure medicine is working. Supplies needed: Blood pressure monitor. A chair to sit in. This should be a chair where you can sit upright with your back supported. Do not sit on a soft couch or an armchair. Table or desk. Small notebook and pencil or pen. How to prepare To get the most accurate reading, avoid the following for 30 minutes before you check your blood pressure: Drinking caffeine. Drinking alcohol. Eating. Smoking. Exercising. Five minutes before you check your blood pressure: Use the bathroom and urinate so that you have an empty bladder. Sit quietly in a chair. Do not talk. How to take your blood pressure To check your blood pressure, follow the instructions in the manual that came with your blood pressure monitor. If you have a  digital blood pressure monitor, the instructions may be as follows: Sit up straight in a chair. Place your feet on the floor. Do not cross your ankles or legs. Rest your left arm at the level of your heart on a table or desk or on the arm of a chair. Pull up your shirt sleeve. Wrap the blood pressure cuff around the upper part of your left arm, 1 inch (2.5 cm) above your elbow. It is best to wrap the cuff around bare skin. Fit the cuff snugly, but not too tightly, around your arm. You should be able to place only one finger between the cuff and your arm. Position the cord so that it rests in the bend of your elbow. Press the power button. Sit quietly while the cuff inflates and deflates. Read the digital reading on the monitor screen and write the numbers down (record them) in a notebook. Wait 2-3 minutes, then repeat the steps, starting at step 1. What does my blood pressure reading mean? A blood pressure reading consists of a higher number over a lower number. Ideally, your blood pressure should be below 120/80. The first ("top") number is called the systolic pressure. It is a measure of the pressure in your arteries as your heart beats. The second ("bottom") number is called the diastolic pressure. It is a measure of the pressure in your arteries as the heart relaxes. Blood pressure is classified into four stages. The following are the stages for adults who do not have a short-term serious illness or a chronic condition. Systolic pressure and diastolic pressure are measured  in a unit called mm Hg (millimeters of mercury).  Normal Systolic pressure: below 120. Diastolic pressure: below 80. Elevated Systolic pressure: 120-129. Diastolic pressure: below 80. Hypertension stage 1 Systolic pressure: 130-139. Diastolic pressure: 80-89. Hypertension stage 2 Systolic pressure: 140 or above. Diastolic pressure: 90 or above. You can have elevated blood pressure or hypertension even if only the  systolic or only the diastolic number in your reading is higher than normal. Follow these instructions at home: Medicines Take over-the-counter and prescription medicines only as told by your health care provider. Tell your health care provider if you are having any side effects from blood pressure medicine. General instructions Check your blood pressure as often as recommended by your health care provider. Check your blood pressure at the same time every day. Take your monitor to the next appointment with your health care provider to make sure that: You are using it correctly. It provides accurate readings. Understand what your goal blood pressure numbers are. Keep all follow-up visits. This is important. General tips Your health care provider can suggest a reliable monitor that will meet your needs. There are several types of home blood pressure monitors. Choose a monitor that has an arm cuff. Do not choose a monitor that measures your blood pressure from your wrist or finger. Choose a cuff that wraps snugly, not too tight or too loose, around your upper arm. You should be able to fit only one finger between your arm and the cuff. You can buy a blood pressure monitor at most drugstores or online. Where to find more information American Heart Association: www.heart.org Contact a health care provider if: Your blood pressure is consistently high. Your blood pressure is suddenly low. Get help right away if: Your systolic blood pressure is higher than 180. Your diastolic blood pressure is higher than 120. These symptoms may be an emergency. Get help right away. Call 911. Do not wait to see if the symptoms will go away. Do not drive yourself to the hospital. Summary Blood pressure is a measurement of how strongly your blood is pressing against the walls of your arteries. A blood pressure reading consists of a higher number over a lower number. Ideally, your blood pressure should be below  120/80. Check your blood pressure at the same time every day. Avoid caffeine, alcohol, smoking, and exercise for 30 minutes prior to checking your blood pressure. These agents can affect the accuracy of the blood pressure reading. This information is not intended to replace advice given to you by your health care provider. Make sure you discuss any questions you have with your health care provider. Document Revised: 08/05/2021 Document Reviewed: 08/05/2021 Elsevier Patient Education  2023 [employer].
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
## 2022-11-15 ENCOUNTER — Encounter: Payer: Self-pay | Admitting: Physician Assistant

## 2022-11-15 DIAGNOSIS — K219 Gastro-esophageal reflux disease without esophagitis: Secondary | ICD-10-CM | POA: Insufficient documentation

## 2022-11-15 DIAGNOSIS — E039 Hypothyroidism, unspecified: Secondary | ICD-10-CM | POA: Insufficient documentation

## 2022-11-15 DIAGNOSIS — Z720 Tobacco use: Secondary | ICD-10-CM | POA: Insufficient documentation

## 2022-11-15 DIAGNOSIS — I10 Essential (primary) hypertension: Secondary | ICD-10-CM | POA: Insufficient documentation

## 2022-11-15 DIAGNOSIS — F411 Generalized anxiety disorder: Secondary | ICD-10-CM | POA: Insufficient documentation

## 2022-11-15 DIAGNOSIS — F1410 Cocaine abuse, uncomplicated: Secondary | ICD-10-CM | POA: Insufficient documentation

## 2022-11-15 DIAGNOSIS — F4310 Post-traumatic stress disorder, unspecified: Secondary | ICD-10-CM | POA: Insufficient documentation

## 2022-11-15 DIAGNOSIS — F3175 Bipolar disorder, in partial remission, most recent episode depressed: Secondary | ICD-10-CM | POA: Insufficient documentation

## 2022-11-15 DIAGNOSIS — F331 Major depressive disorder, recurrent, moderate: Secondary | ICD-10-CM | POA: Insufficient documentation

## 2022-11-15 DIAGNOSIS — G5601 Carpal tunnel syndrome, right upper limb: Secondary | ICD-10-CM | POA: Insufficient documentation

## 2022-11-15 DIAGNOSIS — Z8673 Personal history of transient ischemic attack (TIA), and cerebral infarction without residual deficits: Secondary | ICD-10-CM | POA: Insufficient documentation

## 2022-12-07 ENCOUNTER — Ambulatory Visit: Payer: Medicaid Other | Admitting: Physician Assistant

## 2022-12-07 DIAGNOSIS — F1410 Cocaine abuse, uncomplicated: Secondary | ICD-10-CM | POA: Diagnosis not present

## 2022-12-07 DIAGNOSIS — K219 Gastro-esophageal reflux disease without esophagitis: Secondary | ICD-10-CM

## 2022-12-07 DIAGNOSIS — F3175 Bipolar disorder, in partial remission, most recent episode depressed: Secondary | ICD-10-CM | POA: Diagnosis not present

## 2022-12-07 DIAGNOSIS — F411 Generalized anxiety disorder: Secondary | ICD-10-CM | POA: Diagnosis not present

## 2022-12-07 DIAGNOSIS — G5601 Carpal tunnel syndrome, right upper limb: Secondary | ICD-10-CM

## 2022-12-07 DIAGNOSIS — F4310 Post-traumatic stress disorder, unspecified: Secondary | ICD-10-CM

## 2022-12-07 DIAGNOSIS — Z8673 Personal history of transient ischemic attack (TIA), and cerebral infarction without residual deficits: Secondary | ICD-10-CM

## 2022-12-07 DIAGNOSIS — I10 Essential (primary) hypertension: Secondary | ICD-10-CM

## 2022-12-07 DIAGNOSIS — F331 Major depressive disorder, recurrent, moderate: Secondary | ICD-10-CM

## 2022-12-07 MED ORDER — AMLODIPINE BESYLATE 10 MG PO TABS: ORAL_TABLET | ORAL | 1 refills | Status: DC

## 2022-12-07 MED ORDER — METOPROLOL SUCCINATE ER 25 MG PO TB24: ORAL_TABLET | ORAL | 1 refills | Status: AC

## 2022-12-07 MED ORDER — GABAPENTIN 400 MG PO CAPS: 800.0000 mg | ORAL_CAPSULE | Freq: Three times a day (TID) | ORAL | 1 refills | Status: AC

## 2022-12-07 NOTE — Progress Notes (Signed)
Established Patient Office Visit  Subjective   Patient ID: Marisa Jones, female    DOB: [birth year]  Age: 52 y.o. MRN: [id number]  No chief complaint on file.  Virtual Visit via Telephone Note  I connected with Marisa Jones on 12/07/22 at  9:30 AM EST by telephone and verified that I am speaking with the correct person using two identifiers.  Location: Patient: Marisa Jones  Provider: [facility] Medicine Unit    I discussed the limitations, risks, security and privacy concerns of performing an evaluation and management service by telephone and the availability of in person appointments. I also discussed with the patient that there may be a patient responsible charge related to this service. The patient expressed understanding and agreed to proceed.   History of Present Illness:    Observations/Objective: Medical history and current medications reviewed, no physical exam completed   Assessment and Plan:   Follow Up Instructions:    I discussed the assessment and treatment plan with the patient. The patient was provided an opportunity to ask questions and all were answered. The patient agreed with the plan and demonstrated an understanding of the instructions.   The patient was advised to call back or seek an in-person evaluation if the symptoms worsen or if the condition fails to improve as anticipated.  I provided *** minutes of non-face-to-face time during this encounter.   Marisa Fulop S Mayers, PA-C  Marisa Jones reports that she is currently being treated for substance abuse at [facility] residential treatment center, states that she arrived 6 days ago, states she does not have a plan for aftercare as of yet.  BP was low this morning - today's 90/60 - move to lunch  Staying here another 30 days and then going back to [place] it dinnertime    States that she has a significant history of previous "mini stroke".  States that she was left  with slight weakness on her left side.  States that she does take a daily anticoagulant.   States that she has a primary care provider at [facility] clinic, states that she does maintain close follow-up with them.   States that she has not had any change to her Synthroid in several years, states her levels are checked every 3 months and last checkup was within normal limits.   States that she suffers from carpal tunnel on her right hand.  States that she maintains it by using a night brace, however she has not had this with her wall staying at [facility]  States that she has been having numbness and pain in her right hand.  States that she is only able to take Tylenol due to her anticoagulant, states that does not offer relief.   States that she is having difficulty sleeping both falling asleep and staying asleep despite taking trazodone 150 mg.  States that she was previously on 300 mg of trazodone with relief.   Endorses depressed moods.  States that she previously used Prozac 10 mg with modest relief.   States that she does not check her blood pressure on a daily basis, states that she has been compliant to blood pressure medications.   1. Bipolar disorder, in partial remission, most recent episode depressed ([place])     2. GAD (generalized anxiety disorder)     3. PTSD (post-traumatic stress disorder)     4. Essential hypertension Continue current regimen, patient encouraged to check blood pressure on a daily basis,  keep a written log and have available for all office visits.  Patient to follow-up with mobile unit in 2 weeks.  Red flags given for prompt reevaluation.    - amLODipine (NORVASC) 10 MG tablet; Take 1 tablet (10 mg total) by mouth daily.  Dispense: 30 tablet; Refill: 1 - metoprolol succinate (TOPROL-XL) 25 MG 24 hr tablet; Take 1 tablet (25 mg total) by mouth daily.  Dispense: 30 tablet; Refill: 1   5. Elevated blood pressure reading in office with diagnosis of hypertension      6. Hypothyroidism, unspecified type Continue current regimen - levothyroxine (SYNTHROID) 75 MCG tablet; Take 1 tablet (75 mcg total) by mouth daily before breakfast.  Dispense: 30 tablet; Refill: 1   7. Moderate episode of recurrent major depressive disorder (HCC) Increase trazodone,restart Prozac - traZODone (DESYREL) 100 MG tablet; Take 2 tablets (200 mg total) by mouth at bedtime.  Dispense: 60 tablet; Refill: 1 - FLUoxetine (PROZAC) 10 MG capsule; Take 1 capsule (10 mg total) by mouth daily.  Dispense: 30 capsule; Refill: 1   Sleep - melatonin and working for now Has noticed improvement with prozac  - not helping her anger  8. History of stroke Continue current regimen - clopidogrel (PLAVIX) 75 MG tablet; Take 1 tablet (75 mg total) by mouth daily.  Dispense: 30 tablet; Refill: 1 - gabapentin (NEURONTIN) 300 MG capsule; Take 2 capsules (600 mg total) by mouth 3 (three) times daily.  Dispense: 180 capsule; Refill: 1 States that she was at 1200 tid Stil having a lot ofnerve pain in both legs  Would like to increase to 800 mg tid Numbness and tingling in both legs - can't feel toes on left foot since she had surgery    9. Carpal tunnel syndrome on right Encourage patient to resume use of night brace, daily stretching   No night brace- states the gabapentin is hleping that  10. Gastroesophageal reflux disease without esophagitis Continue current regimen - omeprazole (PRILOSEC) 20 MG capsule; Take 1 capsule (20 mg total) by mouth daily.  Dispense: 30 capsule; Refill: 1     11. Cocaine abuse ([place]) Currently in substance abuse treatment program   12. Tobacco abuse     13. Need for Tdap vaccination   - Tdap vaccine greater than or equal to 7yo IM   14. Flu vaccine need   - Flu Vaccine QUAD 6+ mos PF IM (Fluarix Quad PF)     I have reviewed the patient's medical history (PMH, PSH, Social History, Family History, Medications, and allergies) , and have been updated if  relevant. I spent 30 minutes reviewing chart and  face to face time with patient.      Past Medical History:  Diagnosis Date   Coronary artery disease    Diabetes mellitus without complication (HCC)    Hypertension    MI (myocardial infarction) ([place])    Renal disorder    Social History   Socioeconomic History   Marital status: Married    Spouse name: Not on file   Number of children: Not on file   Years of education: Not on file   Highest education level: Not on file  Occupational History   Not on file  Tobacco Use   Smoking status: Every Day    Packs/day: 0.50    Types: Cigarettes   Smokeless tobacco: Never  Substance and Sexual Activity   Alcohol use: Yes    Comment: occassionally   Drug use: Never   Sexual activity:  Not on file  Other Topics Concern   Not on file  Social History Narrative   Not on file   Social Determinants of Health   Financial Resource Strain: Not on file  Food Insecurity: Not on file  Transportation Needs: Not on file  Physical Activity: Not on file  Stress: Not on file  Social Connections: Not on file  Intimate Partner Violence: Not on file   No family history on file. Allergies  Allergen Reactions   Fish Allergy Anaphylaxis   Lisinopril Swelling    Lip swelling    ROS    Objective:     There were no vitals taken for this visit.   Physical Exam   No results found for any visits on 12/07/22.  {Labs (Optional):23779}  The 10-year ASCVD risk score (Arnett DK, et al., 2019) is: 25.7%    Assessment & Plan:   Problem List Items Addressed This Visit   None   No follow-ups on file.    Kasandra Knudsen Mayers, PA-C

## 2022-12-07 NOTE — Patient Instructions (Signed)
To help better regulate your blood pressure readings, you are going to change your amlodipine and metoprolol to dosing them at lunchtime.  I do encourage you to continue checking your blood pressure on a daily basis, keeping a written log and having available for all office visits.  To help with your neuropathy, you will increase gabapentin 800 mg 3 times a day.  He will follow-up with the mobile unit in 2 weeks for further evaluation.  Kennieth Rad, PA-C Physician Assistant [place] [email or web address]   Hypotension As the heart beats, it forces blood through the body. Hypotension, commonly called low blood pressure, is when the force of blood pumping through the arteries is too weak. Arteries are blood vessels that carry blood from the heart throughout the body. Depending on the cause and severity, hypotension may be harmless (benign) or may cause serious problems (be critical). When your blood pressure is too low, you may not get enough blood to your brain or to the rest of your organs. This can cause weakness, light-headedness, a rapid heartbeat, and fainting. What are the causes? This condition may be caused by: Blood loss. Loss of body fluids (dehydration). Heart problems. Hormone (endocrine) problems. Pregnancy. Severe infection. Lack of certain nutrients. Severe allergic reactions (anaphylaxis). Certain medicines, such as blood pressure medicine or medicines that make the body lose excess fluids (diuretics). Sometimes, hypotension may be caused by not taking medicine as directed, such as taking too much of a certain medicine. What increases the risk? The following factors may make you more likely to develop this condition: Age. Risk increases as you get older. Having a condition that affects the heart or the central nervous system. What are the signs or symptoms? Common symptoms of this condition  include: Weakness. Light-headedness. Dizziness. Blurred vision. Tiredness (fatigue). Rapid heartbeat. Fainting, in severe cases. How is this diagnosed? This condition is diagnosed based on: Your medical history. Your symptoms. Your blood pressure measurement. Your health care provider will check your blood pressure when you are: Lying down. Sitting. Standing. A blood pressure reading is recorded as two numbers, such as "120 over 80" (or 120/80). The first ("top") number is called the systolic pressure. It is a measure of the pressure in your arteries as your heart beats. The second ("bottom") number is called the diastolic pressure. It is a measure of the pressure in your arteries when your heart relaxes between beats. Blood pressure is measured in a unit called mm Hg. Healthy blood pressure for most adults is 120/80. If your blood pressure is below 90/60, you may be diagnosed with hypotension. Other information or tests that may be used to diagnose hypotension include: Your other vital signs, such as your heart rate and temperature. Blood tests. Tilt table test. For this test, you will be safely secured to a table that moves you from a lying position to an upright position. Your heart rhythm and blood pressure will be monitored during the test. How is this treated? Treatment for this condition may include: Changing your diet. This may involve drinking more water or increasing your salt (sodium) intake with high-sodium foods. Taking medicines to raise your blood pressure. Changing the dosage of certain medicines you are taking that might be lowering your blood pressure. Wearing compression stockings. These stockings help to prevent blood clots and reduce swelling in your legs. In some cases, you may need to go to the hospital for: Fluid replacement. This means you will receive fluids through an IV. Blood  replacement. This means you will receive donated blood through an IV  (transfusion). Treating an infection or heart problems, if this applies. Monitoring. You may need to be monitored while medicines that you are taking wear off. Follow these instructions at home: Eating and drinking  Drink enough fluid to keep your urine pale yellow. Eat a healthy diet, and follow instructions from your health care provider about eating or drinking restrictions. A healthy diet includes: Fresh fruits and vegetables. Whole grains. Lean meats. Low-fat dairy products. Increase your salt intake if told to do so. Do not add extra salt to your diet unless your health care provider tells you to do that. Eat frequent, small meals. Avoid standing up suddenly after eating. Medicines Take over-the-counter and prescription medicines only as told by your health care provider. Follow instructions from your health care provider about changing the dosage of your current medicines, if this applies. Do not stop or adjust any of your medicines on your own. General instructions  Wear compression stockings as told by your health care provider. Get up slowly from lying down or sitting positions. This gives your blood pressure a chance to adjust. Avoid hot showers and excessive heat as directed by your health care provider. Return to your normal activities as told by your health care provider. Ask your health care provider what activities are safe for you. Do not use any products that contain nicotine or tobacco. These products include cigarettes, chewing tobacco, and vaping devices, such as e-cigarettes. If you need help quitting, ask your health care provider. Keep all follow-up visits. This is important. Contact a health care provider if: You vomit. You have diarrhea. You have a fever for more than 2-3 days. You feel more thirsty than usual. You feel weak and tired. Get help right away if: You have chest pain. You have a fast or irregular heartbeat. You develop numbness in any part of  your body. You cannot move your arms or your legs. You have trouble speaking. You become sweaty or feel light-headed. You faint. You feel short of breath. You have trouble staying awake. You feel confused. These symptoms may be an emergency. Get help right away. Call 911. Do not wait to see if the symptoms will go away. Do not drive yourself to the hospital. Summary Hypotension is when the force of blood pumping through the arteries is too weak. Hypotension may be harmless (benign) or may cause serious problems (be critical). Treatment for this condition may include changing your diet, changing your medicines, and wearing compression stockings. In some cases, you may need to go to the hospital for fluid or blood replacement. This information is not intended to replace advice given to you by your health care provider. Make sure you discuss any questions you have with your health care provider. Document Revised: 07/12/2021 Document Reviewed: 07/12/2021 Elsevier Patient Education  [place].

## 2022-12-08 ENCOUNTER — Encounter: Payer: Self-pay | Admitting: Physician Assistant

## 2022-12-20 ENCOUNTER — Encounter (HOSPITAL_BASED_OUTPATIENT_CLINIC_OR_DEPARTMENT_OTHER): Payer: Self-pay

## 2022-12-20 ENCOUNTER — Emergency Department (HOSPITAL_BASED_OUTPATIENT_CLINIC_OR_DEPARTMENT_OTHER)
Admission: EM | Admit: 2022-12-20 | Discharge: 2022-12-20 | Disposition: A | Discharge status: Home / Self Care | Payer: Medicaid Other | Attending: Emergency Medicine | Admitting: Emergency Medicine

## 2022-12-20 ENCOUNTER — Other Ambulatory Visit: Payer: Self-pay

## 2022-12-20 DIAGNOSIS — H0289 Other specified disorders of eyelid: Secondary | ICD-10-CM | POA: Insufficient documentation

## 2022-12-20 DIAGNOSIS — Z79899 Other long term (current) drug therapy: Secondary | ICD-10-CM | POA: Insufficient documentation

## 2022-12-20 DIAGNOSIS — E119 Type 2 diabetes mellitus without complications: Secondary | ICD-10-CM | POA: Diagnosis not present

## 2022-12-20 DIAGNOSIS — Z7902 Long term (current) use of antithrombotics/antiplatelets: Secondary | ICD-10-CM | POA: Insufficient documentation

## 2022-12-20 DIAGNOSIS — I2510 Atherosclerotic heart disease of native coronary artery without angina pectoris: Secondary | ICD-10-CM | POA: Diagnosis not present

## 2022-12-20 DIAGNOSIS — H579 Unspecified disorder of eye and adnexa: Secondary | ICD-10-CM

## 2022-12-20 DIAGNOSIS — I10 Essential (primary) hypertension: Secondary | ICD-10-CM | POA: Insufficient documentation

## 2022-12-20 MED ORDER — DOXYCYCLINE HYCLATE 100 MG PO CAPS: 100.0000 mg | ORAL_CAPSULE | Freq: Two times a day (BID) | ORAL | 0 refills | Status: DC

## 2022-12-20 MED ORDER — DOXYCYCLINE HYCLATE 100 MG PO CAPS: 100.0000 mg | ORAL_CAPSULE | Freq: Two times a day (BID) | ORAL | 0 refills | Status: AC

## 2022-12-20 NOTE — ED Provider Notes (Addendum)
[place] EMERGENCY DEPARTMENT Provider Note   CSN: [id number] Arrival date & time: 12/20/22  1018     History  Chief Complaint  Patient presents with   Eye Problem   HPI Marisa Jones is a 51 y.o. female with history of hypertension, diabetes, and coronary artery disease presenting for eye problem.  Located in the right lower eyelid.  She believes that it is a stye.  States she noticed that Thursday and the "bump" on her eyelid has gotten bigger.  Started to bleed last night.  Denies pustulant discharge.  The lesion is itchy and at times painful. States it is starting to impact her vision as well.  States she has had a stye in the past but it was never this large.  States that she is treating with warm compresses at home. Denies fever.    Eye Problem      Home Medications Prior to Admission medications   Medication Sig Start Date End Date Taking? Authorizing Provider  doxycycline (VIBRAMYCIN) 100 MG capsule Take 1 capsule (100 mg total) by mouth 2 (two) times daily. 12/20/22  Yes Harriet Pho, PA-C  amLODipine (NORVASC) 10 MG tablet Take 10mg  PO once daily with lunch 12/07/22   Mayers, Cari S, PA-C  clopidogrel (PLAVIX) 75 MG tablet Take 1 tablet (75 mg total) by mouth daily. 11/14/22   Mayers, Cari S, PA-C  FLUoxetine (PROZAC) 10 MG capsule Take 1 capsule (10 mg total) by mouth daily. 11/14/22   Mayers, Cari S, PA-C  gabapentin (NEURONTIN) 400 MG capsule Take 2 capsules (800 mg total) by mouth 3 (three) times daily. 12/07/22   Mayers, Loraine Grip, PA-C  levothyroxine (SYNTHROID) 75 MCG tablet Take 1 tablet (75 mcg total) by mouth daily before breakfast. 11/14/22   Mayers, Cari S, PA-C  metoprolol succinate (TOPROL-XL) 25 MG 24 hr tablet Take 1 tab PO once daily with lunch 12/07/22   Mayers, Cari S, PA-C  omeprazole (PRILOSEC) 20 MG capsule Take 1 capsule (20 mg total) by mouth daily. 11/14/22   Mayers, Cari S, PA-C  traZODone (DESYREL) 100 MG tablet Take 2 tablets (200 mg total)  by mouth at bedtime. 11/14/22 12/14/22  Mayers, Cari S, PA-C      Allergies    Fish allergy and Lisinopril    Review of Systems   Review of Systems  Eyes:        Lesion on the eyelid    Physical Exam Updated Vital Signs BP 138/81 (BP Location: Right Arm)   Pulse 63   Temp 98.5 F (36.9 C) (Oral)   Resp 18   SpO2 99%  Physical Exam Constitutional:      Appearance: Normal appearance.  HENT:     Head: Normocephalic.     Nose: Nose normal.  Eyes:     Extraocular Movements: Extraocular movements intact.     Conjunctiva/sclera: Conjunctivae normal.     Pupils: Pupils are equal, round, and reactive to light.   Pulmonary:     Effort: Pulmonary effort is normal.  Neurological:     Mental Status: She is alert.  Psychiatric:        Mood and Affect: Mood normal.     ED Results / Procedures / Treatments   Labs (all labs ordered are listed, but only abnormal results are displayed) Labs Reviewed - No data to display  EKG None  Radiology No results found.  Procedures Procedures    Medications Ordered in ED Medications - No data to  display  ED Course/ Medical Decision Making/ A&P                             Medical Decision Making Risk Prescription drug management.   52 year old female who is well-appearing presenting for right arm problem.  Exam notable for lesion at the right lower eyelid.  Differential diagnosis for this includes hordeolum, chalazion, and skin mass.  Mass was not fluctuant but is erythematous and tender. Cannot rule out infection. Sent doxycycline to pharmacy. Appears more like a skin mass and less like hordeolum or chalazion.  Advised to continue warm compresses and follow-up with her PCP.      Final Clinical Impression(s) / ED Diagnoses Final diagnoses:  Eye problem    Rx / DC Orders ED Discharge Orders          Ordered    doxycycline (VIBRAMYCIN) 100 MG capsule  2 times daily        12/20/22 1140                 Harriet Pho, PA-C 12/20/22 1142    Regan Lemming, MD 12/21/22 [phone]

## 2022-12-20 NOTE — ED Triage Notes (Addendum)
Pt, from [facility], c/o possible stye under R eye x1.5 weeks. Pain score 5/10.  Pt reports using warm compresses and area has decreased, but she noted bloody drainage today.  No current bleeding noted.  No redness noted to eye.

## 2022-12-20 NOTE — Discharge Instructions (Addendum)
Evaluation of your eye problem reveals that you likely have some sort of skin mass over your right eyelid.  Recommend that you continue your warm compresses and follow-up with your PCP.  Sent doxycycline to your pharmacy to cover for associated infection since the lesion is red and tender to touch.

## 2022-12-26 ENCOUNTER — Ambulatory Visit: Payer: Medicaid Other | Admitting: Physician Assistant

## 2022-12-26 ENCOUNTER — Encounter: Payer: Self-pay | Admitting: Physician Assistant

## 2022-12-26 VITALS — BP 96/71 | HR 60 | Ht 62.0 in | Wt 162.0 lb

## 2022-12-26 DIAGNOSIS — F411 Generalized anxiety disorder: Secondary | ICD-10-CM | POA: Diagnosis not present

## 2022-12-26 DIAGNOSIS — Z6829 Body mass index (BMI) 29.0-29.9, adult: Secondary | ICD-10-CM

## 2022-12-26 DIAGNOSIS — I10 Essential (primary) hypertension: Secondary | ICD-10-CM | POA: Diagnosis not present

## 2022-12-26 DIAGNOSIS — F331 Major depressive disorder, recurrent, moderate: Secondary | ICD-10-CM

## 2022-12-26 DIAGNOSIS — E039 Hypothyroidism, unspecified: Secondary | ICD-10-CM | POA: Diagnosis not present

## 2022-12-26 DIAGNOSIS — F3175 Bipolar disorder, in partial remission, most recent episode depressed: Secondary | ICD-10-CM

## 2022-12-26 DIAGNOSIS — F1410 Cocaine abuse, uncomplicated: Secondary | ICD-10-CM

## 2022-12-26 DIAGNOSIS — F4310 Post-traumatic stress disorder, unspecified: Secondary | ICD-10-CM

## 2022-12-26 DIAGNOSIS — F17210 Nicotine dependence, cigarettes, uncomplicated: Secondary | ICD-10-CM

## 2022-12-26 DIAGNOSIS — Z8673 Personal history of transient ischemic attack (TIA), and cerebral infarction without residual deficits: Secondary | ICD-10-CM

## 2022-12-26 MED ORDER — TRAZODONE HCL 100 MG PO TABS: 250.0000 mg | ORAL_TABLET | Freq: Every day | ORAL | 0 refills | Status: AC

## 2022-12-26 MED ORDER — AMLODIPINE BESYLATE 5 MG PO TABS: ORAL_TABLET | ORAL | 0 refills | Status: AC

## 2022-12-26 NOTE — Progress Notes (Signed)
Established Patient Office Visit  Subjective   Patient ID: Marisa Jones, female    DOB: 02-11-1971  Age: 52 y.o. MRN: [id number]  Chief Complaint  Patient presents with   Medication Refill    Patient has been on Antibiotics for cyst on right eye     Patient states that she continues to be treated for substance abuse at [facility] residential treatment center.  States that she will be transitioning to an [employer], states that she is unsure on timeframe.  Reports that she started having pain in her right ear shortly after being diagnosed with COVID states that she was seen in the emergency department on December 20, 2022 for a bump under her right eye, states that she has been taking doxycycline.  States that she has not noticed any improvement.  Denies any other continued upper respiratory symptoms.  States that she continues to have lower blood pressure readings, similar to today's despite moving blood pressure dosing to lunchtime.  Does endorse today that she was not consistent with her blood pressure medications in the past and is unsure if her dosing caused lower blood pressure readings previously.  States that the increase in gabapentin did offer some relief but she is still having pain.  States that she continues to have difficulty sleeping despite taking 200 mg of trazodone.  States that she wakes up several times throughout the night and is unable to fall back asleep.    Past Medical History:  Diagnosis Date   Coronary artery disease    Diabetes mellitus without complication (HCC)    Hypertension    MI (myocardial infarction) (HCC)    Renal disorder    Social History   Socioeconomic History   Marital status: Married    Spouse name: Not on file   Number of children: Not on file   Years of education: Not on file   Highest education level: Not on file  Occupational History   Not on file  Tobacco Use   Smoking status: Every Day    Packs/day: 0.50    Types: Cigarettes    Smokeless tobacco: Never  Substance and Sexual Activity   Alcohol use: Yes    Comment: occassionally   Drug use: Never   Sexual activity: Not on file  Other Topics Concern   Not on file  Social History Narrative   Not on file   Social Determinants of Health   Financial Resource Strain: Not on file  Food Insecurity: Not on file  Transportation Needs: Not on file  Physical Activity: Not on file  Stress: Not on file  Social Connections: Not on file  Intimate Partner Violence: Not on file   History reviewed. No pertinent family history. Allergies  Allergen Reactions   Fish Allergy Anaphylaxis   Lisinopril Swelling    Lip swelling    Review of Systems  Constitutional: Negative.   HENT:  Positive for ear pain. Negative for congestion, ear discharge, hearing loss, sore throat and tinnitus.   Eyes: Negative.   Respiratory:  Negative for shortness of breath.   Cardiovascular:  Negative for chest pain.  Gastrointestinal: Negative.   Genitourinary: Negative.   Musculoskeletal: Negative.   Neurological:  Negative for dizziness, weakness and headaches.  Endo/Heme/Allergies: Negative.   Psychiatric/Behavioral: Negative.        Objective:     BP 96/71 (BP Location: Left Arm, Patient Position: Sitting, Cuff Size: Normal)   Pulse 60   Ht 5\' 2"  (1.575 m)  Wt 162 lb (73.5 kg)   SpO2 96%   BMI 29.63 kg/m  BP Readings from Last 3 Encounters:  12/26/22 96/71  12/20/22 138/81  11/14/22 (!) 148/95   Wt Readings from Last 3 Encounters:  12/26/22 162 lb (73.5 kg)  11/14/22 159 lb (72.1 kg)  01/12/20 149 lb (67.6 kg)      Physical Exam Vitals and nursing note reviewed.  Constitutional:      Appearance: Normal appearance.  HENT:     Head: Normocephalic and atraumatic.     Salivary Glands: Right salivary gland is not diffusely enlarged or tender. Left salivary gland is not diffusely enlarged or tender.     Right Ear: External ear normal. There is impacted cerumen.      Left Ear: Tympanic membrane, ear canal and external ear normal.     Ears:     Comments: Unable to visualize right TM    Nose: Nose normal.     Mouth/Throat:     Pharynx: Oropharynx is clear. No posterior oropharyngeal erythema.     Tonsils: No tonsillar exudate or tonsillar abscesses.  Cardiovascular:     Rate and Rhythm: Normal rate and regular rhythm.     Pulses: Normal pulses.     Heart sounds: Normal heart sounds.  Pulmonary:     Effort: Pulmonary effort is normal.     Breath sounds: Normal breath sounds.  Musculoskeletal:        General: Normal range of motion.     Cervical back: Normal range of motion and neck supple.  Skin:    General: Skin is warm and dry.  Neurological:     General: No focal deficit present.     Mental Status: She is oriented to person, place, and time.  Psychiatric:        Mood and Affect: Mood normal.        Behavior: Behavior normal.        Thought Content: Thought content normal.        Judgment: Judgment normal.        Assessment & Plan:   Problem List Items Addressed This Visit       Cardiovascular and Mediastinum   Essential hypertension - Primary   Relevant Medications   amLODipine (NORVASC) 5 MG tablet   Other Relevant Orders   Basic Metabolic Panel (Completed)     Endocrine   Hypothyroidism   Relevant Orders   Thyroid Panel With TSH (Completed)     Other   Bipolar disorder, in partial remission, most recent episode depressed (HCC)   GAD (generalized anxiety disorder)   Relevant Medications   traZODone (DESYREL) 100 MG tablet   PTSD (post-traumatic stress disorder)   Relevant Medications   traZODone (DESYREL) 100 MG tablet   Moderate episode of recurrent major depressive disorder (HCC)   Relevant Medications   traZODone (DESYREL) 100 MG tablet   History of stroke   Cocaine abuse ([place])   Other Visit Diagnoses     BMI 29.0-29.9,adult         1. Essential hypertension Decrease amlodipine 5 mg.  Patient encouraged to  continue checking blood pressure on a daily basis, keeping a written log and having available for all office visits.  Red flags given for prompt reevaluation - Basic Metabolic Panel - amLODipine (NORVASC) 5 MG tablet; Take 5mg  PO once daily with lunch  Dispense: 30 tablet; Refill: 0  2. Bipolar disorder, in partial remission, most recent episode depressed ([place])   3.  Moderate episode of recurrent major depressive disorder (HCC) Increase trazodone 2 mg. - traZODone (DESYREL) 100 MG tablet; Take 2.5 tablets (250 mg total) by mouth at bedtime.  Dispense: 75 tablet; Refill: 0  4. GAD (generalized anxiety disorder)   5. PTSD (post-traumatic stress disorder)   6. Hypothyroidism, unspecified type  - Thyroid Panel With TSH  7. History of stroke   8. Cocaine abuse ([place]) Currently in substance abuse treatment program  9. BMI 29.0-29.9,adult   I have reviewed the patient's medical history (PMH, PSH, Social History, Family History, Medications, and allergies) , and have been updated if relevant. I spent 30 minutes reviewing chart and  face to face time with patient.     Return in about 2 weeks (around 01/09/2023) for with [facility].    Loraine Grip Mayers, PA-C

## 2022-12-26 NOTE — Patient Instructions (Addendum)
You are going to take a lower dose of amlodipine 5 mg once daily at lunchtime.  Continue checking your blood pressure on a daily basis, keeping a written log and having available for all office visits.  You will take an increased dose of trazodone 250 mg at bedtime.  I encourage you to continue using Tylenol for your ear pain, finish regimen of doxycycline.  Kennieth Rad, PA-C Physician Assistant [facility] [email or web address]   Earache, Adult An earache, or ear pain, can be caused by many things, including: An infection. Ear wax buildup. Ear pressure. Something in the ear that should not be there (foreign body). A sore throat. Tooth problems. Jaw problems. Treatment of the earache will depend on the cause. If the cause is not clear or cannot be known, you may need to watch your symptoms until your earache goes away or until a cause is found. Follow these instructions at home: Medicines Take or apply over-the-counter and prescription medicines only as told by your health care provider. If you were prescribed antibiotics, use them as told by your health care provider. Do not stop using the antibiotic even if you start to feel better. Do not put anything in your ear other than medicine that is prescribed by your health care provider. Managing pain     If directed, apply heat to the affected area as often as told by your health care provider. Use the heat source that your health care provider recommends, such as a moist heat pack or a heating pad. Place a towel between your skin and the heat source. Leave the heat on for 20-30 minutes. If your skin turns bright red, remove the heat right away to prevent burns. The risk of burns is higher if you cannot feel pain, heat, or cold. If directed, put ice on the affected area. To do this: Put ice in a plastic bag. Place a towel between your skin and the bag. Leave the ice on for 20  minutes, 2-3 times a day. If your skin turns bright red, remove the ice right away to prevent skin damage. The risk of skin damage is higher if you cannot feel pain, heat, or cold.  General instructions Pay attention to any changes in your symptoms. Try resting in an upright position instead of lying down. This may help to reduce pressure in your ear and relieve pain. Chew gum if it helps to relieve your ear pain. Treat any allergies as told by your health care provider. Drink enough fluid to keep your urine pale yellow. It is up to you to get the results of any tests that were done. Ask your health care provider, or the department that is doing the tests, when your results will be ready. Contact a health care provider if: Your pain does not improve within 2 days. Your earache gets worse. You have new symptoms. You have a fever. Get help right away if: You have a severe headache. You have a stiff neck. You have trouble swallowing. You have redness or swelling behind your ear. You have fluid or blood coming from your ear. You have hearing loss. You feel dizzy. This information is not intended to replace advice given to you by your health care provider. Make sure you discuss any questions you have with your health care provider. Document Revised: 04/04/2022 Document Reviewed: 04/04/2022 Elsevier Patient Education  [place].

## 2022-12-27 ENCOUNTER — Encounter: Payer: Self-pay | Admitting: Physician Assistant

## 2022-12-27 LAB — THYROID PANEL WITH TSH
Free Thyroxine Index: 2.2 (ref 1.2–4.9)
T3 Uptake Ratio: 27 % (ref 24–39)
T4, Total: 8.2 ug/dL (ref 4.5–12.0)
TSH: 1.48 u[IU]/mL (ref 0.450–4.500)

## 2022-12-27 LAB — BASIC METABOLIC PANEL
BUN/Creatinine Ratio: 27 — ABNORMAL HIGH (ref 9–23)
BUN: 19 mg/dL (ref 6–24)
CO2: 20 mmol/L (ref 20–29)
Calcium: 9 mg/dL (ref 8.7–10.2)
Chloride: 103 mmol/L (ref 96–106)
Creatinine, Ser: 0.71 mg/dL (ref 0.57–1.00)
Glucose: 95 mg/dL (ref 70–99)
Potassium: 4.3 mmol/L (ref 3.5–5.2)
Sodium: 141 mmol/L (ref 134–144)
eGFR: 103 mL/min/{1.73_m2} (ref >59)

## 2023-02-17 ENCOUNTER — Encounter (HOSPITAL_COMMUNITY): Payer: Self-pay | Admitting: Emergency Medicine

## 2023-02-17 ENCOUNTER — Emergency Department (HOSPITAL_COMMUNITY)
Admission: EM | Admit: 2023-02-17 | Discharge: 2023-02-17 | Disposition: A | Discharge status: Home / Self Care | Payer: Medicaid Other | Attending: Emergency Medicine | Admitting: Emergency Medicine

## 2023-02-17 ENCOUNTER — Emergency Department (HOSPITAL_COMMUNITY): Payer: Medicaid Other

## 2023-02-17 ENCOUNTER — Other Ambulatory Visit: Payer: Self-pay

## 2023-02-17 DIAGNOSIS — M545 Low back pain, unspecified: Secondary | ICD-10-CM | POA: Insufficient documentation

## 2023-02-17 DIAGNOSIS — I10 Essential (primary) hypertension: Secondary | ICD-10-CM | POA: Insufficient documentation

## 2023-02-17 DIAGNOSIS — R0600 Dyspnea, unspecified: Secondary | ICD-10-CM | POA: Insufficient documentation

## 2023-02-17 DIAGNOSIS — E119 Type 2 diabetes mellitus without complications: Secondary | ICD-10-CM | POA: Insufficient documentation

## 2023-02-17 DIAGNOSIS — Z7902 Long term (current) use of antithrombotics/antiplatelets: Secondary | ICD-10-CM | POA: Insufficient documentation

## 2023-02-17 DIAGNOSIS — Z79899 Other long term (current) drug therapy: Secondary | ICD-10-CM | POA: Diagnosis not present

## 2023-02-17 DIAGNOSIS — R110 Nausea: Secondary | ICD-10-CM | POA: Diagnosis not present

## 2023-02-17 DIAGNOSIS — R079 Chest pain, unspecified: Secondary | ICD-10-CM | POA: Diagnosis present

## 2023-02-17 DIAGNOSIS — I2510 Atherosclerotic heart disease of native coronary artery without angina pectoris: Secondary | ICD-10-CM | POA: Diagnosis not present

## 2023-02-17 DIAGNOSIS — R0602 Shortness of breath: Secondary | ICD-10-CM | POA: Insufficient documentation

## 2023-02-17 LAB — CBC
HCT: 39 % (ref 36.0–46.0)
Hemoglobin: 12.5 g/dL (ref 12.0–15.0)
MCH: 28.2 pg (ref 26.0–34.0)
MCHC: 32.1 g/dL (ref 30.0–36.0)
MCV: 88 fL (ref 80.0–100.0)
Platelets: 247 10*3/uL (ref 150–400)
RBC: 4.43 MIL/uL (ref 3.87–5.11)
RDW: 15.9 % — ABNORMAL HIGH (ref 11.5–15.5)
WBC: 6.2 10*3/uL (ref 4.0–10.5)
nRBC: 0 % (ref 0.0–0.2)

## 2023-02-17 LAB — TROPONIN I (HIGH SENSITIVITY)
Troponin I (High Sensitivity): <2 ng/L (ref <18)
Troponin I (High Sensitivity): <2 ng/L (ref <18)

## 2023-02-17 LAB — BASIC METABOLIC PANEL
Anion gap: 7 (ref 5–15)
BUN: 12 mg/dL (ref 6–20)
CO2: 25 mmol/L (ref 22–32)
Calcium: 8.8 mg/dL — ABNORMAL LOW (ref 8.9–10.3)
Chloride: 107 mmol/L (ref 98–111)
Creatinine, Ser: 0.76 mg/dL (ref 0.44–1.00)
GFR, Estimated: >60 mL/min (ref >60)
Glucose, Bld: 102 mg/dL — ABNORMAL HIGH (ref 70–99)
Potassium: 3.9 mmol/L (ref 3.5–5.1)
Sodium: 139 mmol/L (ref 135–145)

## 2023-02-17 LAB — D-DIMER, QUANTITATIVE: D-Dimer, Quant: <0.27 ug/mL-FEU (ref 0.00–0.50)

## 2023-02-17 LAB — I-STAT BETA HCG BLOOD, ED (MC, WL, AP ONLY): I-stat hCG, quantitative: <5 m[IU]/mL (ref <5)

## 2023-02-17 MED ORDER — NITROGLYCERIN 0.4 MG SL SUBL
0.4000 mg | SUBLINGUAL_TABLET | SUBLINGUAL | Status: DC | PRN
  Administered 2023-02-17: 0.4 mg via SUBLINGUAL
  Filled 2023-02-17: qty 1

## 2023-02-17 MED ORDER — PROPOFOL 1000 MG/100ML IV EMUL
INTRAVENOUS | Status: AC
  Filled 2023-02-17: qty 100

## 2023-02-17 MED ORDER — ASPIRIN 81 MG PO CHEW
324.0000 mg | CHEWABLE_TABLET | Freq: Once | ORAL | Status: AC
  Administered 2023-02-17: 324 mg via ORAL
  Filled 2023-02-17: qty 4

## 2023-02-17 NOTE — ED Notes (Signed)
Pt. Transported to Xray 

## 2023-02-17 NOTE — ED Triage Notes (Signed)
Patient arrives ambulatory by POV c/o central chest pain, upper back pain and shortness of breath since this morning. Pain does not radiate and describes it as a dull ache.

## 2023-02-17 NOTE — ED Provider Notes (Signed)
[place] EMERGENCY DEPARTMENT AT [facility] Provider Note   CSN: [id number] Arrival date & time: 02/17/23  [id number]     History {Add pertinent medical, surgical, social history, OB history to HPI:1} Chief Complaint  Patient presents with   Chest Pain   Back Pain   Shortness of Breath    Marisa Jones is a 52 y.o. female.  HPI     7/10 pain, feels like aching, slight left side, not positional, exertional, nor pleuritic Some associated dyspnea and nausea No cough, diaphoresis, abdomina pain, numbness, weakness, leg pain or swelling  Seen Cardiologist at [facility] no known heart disease or DM N9o hx of dvt/pe  Not exertional   Smoking Family hx of early CAD in mom   No etoh or other drugs, recovering addict  Past Medical History:  Diagnosis Date   Coronary artery disease    Diabetes mellitus without complication ([place])    Hypertension    MI (myocardial infarction) ([place])    Renal disorder      Home Medications Prior to Admission medications   Medication Sig Start Date End Date Taking? Authorizing Provider  amLODipine (NORVASC) 5 MG tablet Take 5mg  PO once daily with lunch 12/26/22   Mayers, Cari S, PA-C  clopidogrel (PLAVIX) 75 MG tablet Take 1 tablet (75 mg total) by mouth daily. 11/14/22   Mayers, Cari S, PA-C  doxycycline (VIBRAMYCIN) 100 MG capsule Take 1 capsule (100 mg total) by mouth 2 (two) times daily. 12/20/22   Harriet Pho, PA-C  FLUoxetine (PROZAC) 10 MG capsule Take 1 capsule (10 mg total) by mouth daily. 11/14/22   Mayers, Cari S, PA-C  gabapentin (NEURONTIN) 400 MG capsule Take 2 capsules (800 mg total) by mouth 3 (three) times daily. 12/07/22   Mayers, Loraine Grip, PA-C  levothyroxine (SYNTHROID) 75 MCG tablet Take 1 tablet (75 mcg total) by mouth daily before breakfast. 11/14/22   Mayers, Cari S, PA-C  metoprolol succinate (TOPROL-XL) 25 MG 24 hr tablet Take 1 tab PO once daily with lunch 12/07/22   Mayers, Cari S, PA-C  omeprazole (PRILOSEC) 20  MG capsule Take 1 capsule (20 mg total) by mouth daily. 11/14/22   Mayers, Cari S, PA-C  traZODone (DESYREL) 100 MG tablet Take 2.5 tablets (250 mg total) by mouth at bedtime. 12/26/22 01/25/23  Mayers, Cari S, PA-C      Allergies    Fish allergy and Lisinopril    Review of Systems   Review of Systems  Physical Exam Updated Vital Signs BP 123/77 (BP Location: Right Arm)   Pulse 64   Temp 98.7 F (37.1 C) (Oral)   Resp 16   Ht 5\' 2"  (1.575 m)   Wt 73.5 kg   SpO2 96%   BMI 29.63 kg/m  Physical Exam  ED Results / Procedures / Treatments   Labs (all labs ordered are listed, but only abnormal results are displayed) Labs Reviewed  BASIC METABOLIC PANEL - Abnormal; Notable for the following components:      Result Value   Glucose, Bld 102 (*)    Calcium 8.8 (*)    All other components within normal limits  CBC - Abnormal; Notable for the following components:   RDW 15.9 (*)    All other components within normal limits  I-STAT BETA HCG BLOOD, ED ([facility], [facility], [facility] ONLY)  TROPONIN I (HIGH SENSITIVITY)  TROPONIN I (HIGH SENSITIVITY)    EKG EKG Interpretation  Date/Time:  Friday February 17 2023 09:53:42 EDT  Ventricular Rate:  91 PR Interval:  160 QRS Duration: 89 QT Interval:  372 QTC Calculation: 458 R Axis:   -2 Text Interpretation: Sinus rhythm Low voltage, precordial leads Anteroseptal infarct, old No significant change since last tracing Confirmed by Gareth Morgan [phone]) on 02/17/2023 10:04:35 AM  Radiology DG Chest 2 View  Result Date: 02/17/2023 CLINICAL DATA:  Chest pain.  Shortness of breath. EXAM: CHEST - 2 VIEW COMPARISON:  Chest radiographs 02/28/2018 FINDINGS: Cardiac silhouette and mediastinal contours are within normal limits. The lungs are clear. No pleural effusion or pneumothorax. Mild multilevel degenerative disc changes of the thoracic spine. IMPRESSION: No active cardiopulmonary disease. Electronically Signed   By: Yvonne Kendall M.D.   On: 02/17/2023 10:12     Procedures Procedures  {Document cardiac monitor, telemetry assessment procedure when appropriate:1}  Medications Ordered in ED Medications  propofol (DIPRIVAN) 1000 MG/100ML infusion (  Not Given 02/17/23 1209)    ED Course/ Medical Decision Making/ A&P   {   Click here for ABCD2, HEART and other calculatorsREFRESH Note before signing :1}                          Medical Decision Making Amount and/or Complexity of Data Reviewed Labs: ordered. Radiology: ordered.   ***  {Document critical care time when appropriate:1} {Document review of labs and clinical decision tools ie heart score, Chads2Vasc2 etc:1}  {Document your independent review of radiology images, and any outside records:1} {Document your discussion with family members, caretakers, and with consultants:1} {Document social determinants of health affecting pt's care:1} {Document your decision making why or why not admission, treatments were needed:1} Final Clinical Impression(s) / ED Diagnoses Final diagnoses:  None    Rx / DC Orders ED Discharge Orders     None
